# Patient Record
Sex: Female | Born: 1935 | Race: White | Hispanic: No | Smoking: Never smoker
Health system: Southern US, Community
[De-identification: ages and names within clinical notes are randomized; demographics above are authoritative.]

## PROBLEM LIST (undated history)

## (undated) DIAGNOSIS — M810 Age-related osteoporosis without current pathological fracture: Secondary | ICD-10-CM

## (undated) DIAGNOSIS — E079 Disorder of thyroid, unspecified: Secondary | ICD-10-CM

## (undated) DIAGNOSIS — D649 Anemia, unspecified: Secondary | ICD-10-CM

## (undated) DIAGNOSIS — K859 Acute pancreatitis without necrosis or infection, unspecified: Secondary | ICD-10-CM

## (undated) DIAGNOSIS — I499 Cardiac arrhythmia, unspecified: Secondary | ICD-10-CM

## (undated) DIAGNOSIS — G629 Polyneuropathy, unspecified: Secondary | ICD-10-CM

## (undated) DIAGNOSIS — J189 Pneumonia, unspecified organism: Secondary | ICD-10-CM

## (undated) DIAGNOSIS — E119 Type 2 diabetes mellitus without complications: Secondary | ICD-10-CM

## (undated) DIAGNOSIS — I10 Essential (primary) hypertension: Secondary | ICD-10-CM

## (undated) DIAGNOSIS — Z22322 Carrier or suspected carrier of Methicillin resistant Staphylococcus aureus: Secondary | ICD-10-CM

## (undated) DIAGNOSIS — D179 Benign lipomatous neoplasm, unspecified: Secondary | ICD-10-CM

## (undated) DIAGNOSIS — K811 Chronic cholecystitis: Secondary | ICD-10-CM

## (undated) DIAGNOSIS — E559 Vitamin D deficiency, unspecified: Secondary | ICD-10-CM

## (undated) DIAGNOSIS — I8290 Acute embolism and thrombosis of unspecified vein: Secondary | ICD-10-CM

## (undated) DIAGNOSIS — M1990 Unspecified osteoarthritis, unspecified site: Secondary | ICD-10-CM

## (undated) DIAGNOSIS — J449 Chronic obstructive pulmonary disease, unspecified: Secondary | ICD-10-CM

## (undated) DIAGNOSIS — D693 Immune thrombocytopenic purpura: Secondary | ICD-10-CM

## (undated) DIAGNOSIS — E785 Hyperlipidemia, unspecified: Secondary | ICD-10-CM

## (undated) DIAGNOSIS — D126 Benign neoplasm of colon, unspecified: Secondary | ICD-10-CM

## (undated) HISTORY — PX: BACK SURGERY: SHX140

## (undated) HISTORY — DX: Chronic obstructive pulmonary disease, unspecified: J44.9

## (undated) HISTORY — PX: APPENDECTOMY: SHX54

## (undated) HISTORY — PX: BLADDER SURGERY: SHX569

## (undated) HISTORY — PX: TONSILLECTOMY: SUR1361

## (undated) HISTORY — DX: Hyperlipidemia, unspecified: E78.5

## (undated) HISTORY — DX: Disorder of thyroid, unspecified: E07.9

## (undated) HISTORY — DX: Anemia, unspecified: D64.9

## (undated) HISTORY — PX: ABDOMINAL HYSTERECTOMY: SHX81

## (undated) HISTORY — DX: Immune thrombocytopenic purpura: D69.3

## (undated) HISTORY — DX: Benign lipomatous neoplasm, unspecified: D17.9

## (undated) HISTORY — DX: Unspecified osteoarthritis, unspecified site: M19.90

## (undated) HISTORY — DX: Chronic cholecystitis: K81.1

## (undated) HISTORY — DX: Vitamin D deficiency, unspecified: E55.9

## (undated) HISTORY — DX: Cardiac arrhythmia, unspecified: I49.9

## (undated) HISTORY — DX: Acute embolism and thrombosis of unspecified vein: I82.90

## (undated) HISTORY — DX: Age-related osteoporosis without current pathological fracture: M81.0

## (undated) HISTORY — DX: Acute pancreatitis without necrosis or infection, unspecified: K85.90

## (undated) HISTORY — DX: Polyneuropathy, unspecified: G62.9

## (undated) HISTORY — DX: Benign neoplasm of colon, unspecified: D12.6

## (undated) HISTORY — DX: Pneumonia, unspecified organism: J18.9

## (undated) HISTORY — DX: Carrier or suspected carrier of methicillin resistant Staphylococcus aureus: Z22.322

## (undated) HISTORY — PX: SPLENECTOMY: SUR1306

---

## 2005-03-17 ENCOUNTER — Ambulatory Visit: Payer: Self-pay | Admitting: Unknown Physician Specialty

## 2005-05-21 ENCOUNTER — Other Ambulatory Visit: Payer: Self-pay

## 2005-05-28 ENCOUNTER — Inpatient Hospital Stay: Payer: Self-pay | Admitting: Unknown Physician Specialty

## 2005-06-08 ENCOUNTER — Inpatient Hospital Stay (HOSPITAL_COMMUNITY)
Admission: RE | Admit: 2005-06-08 | Discharge: 2005-07-01 | Payer: Self-pay | Admitting: Physical Medicine & Rehabilitation

## 2005-06-08 ENCOUNTER — Ambulatory Visit: Payer: Self-pay | Admitting: Physical Medicine & Rehabilitation

## 2005-07-02 ENCOUNTER — Encounter
Admission: RE | Admit: 2005-07-02 | Discharge: 2005-09-30 | Payer: Self-pay | Admitting: Physical Medicine & Rehabilitation

## 2005-07-29 ENCOUNTER — Inpatient Hospital Stay: Payer: Self-pay

## 2005-07-31 ENCOUNTER — Other Ambulatory Visit: Payer: Self-pay

## 2005-08-03 ENCOUNTER — Ambulatory Visit: Payer: Self-pay | Admitting: Physical Medicine & Rehabilitation

## 2005-08-12 ENCOUNTER — Ambulatory Visit: Payer: Self-pay | Admitting: Physical Medicine & Rehabilitation

## 2005-08-30 ENCOUNTER — Encounter: Payer: Self-pay | Admitting: Unknown Physician Specialty

## 2005-09-26 ENCOUNTER — Emergency Department: Payer: Self-pay | Admitting: Emergency Medicine

## 2005-09-27 ENCOUNTER — Encounter: Payer: Self-pay | Admitting: Unknown Physician Specialty

## 2005-10-28 ENCOUNTER — Encounter: Payer: Self-pay | Admitting: Unknown Physician Specialty

## 2005-11-25 ENCOUNTER — Encounter: Payer: Self-pay | Admitting: Unknown Physician Specialty

## 2005-12-26 ENCOUNTER — Encounter: Payer: Self-pay | Admitting: Unknown Physician Specialty

## 2006-01-06 ENCOUNTER — Encounter
Admission: RE | Admit: 2006-01-06 | Discharge: 2006-04-06 | Payer: Self-pay | Admitting: Physical Medicine & Rehabilitation

## 2006-01-06 ENCOUNTER — Ambulatory Visit: Payer: Self-pay | Admitting: Physical Medicine & Rehabilitation

## 2006-01-25 ENCOUNTER — Encounter: Payer: Self-pay | Admitting: Unknown Physician Specialty

## 2006-02-25 ENCOUNTER — Encounter: Payer: Self-pay | Admitting: Unknown Physician Specialty

## 2006-03-27 ENCOUNTER — Encounter: Payer: Self-pay | Admitting: Unknown Physician Specialty

## 2006-04-27 ENCOUNTER — Encounter: Payer: Self-pay | Admitting: Unknown Physician Specialty

## 2006-04-29 ENCOUNTER — Ambulatory Visit: Payer: Self-pay | Admitting: Physical Medicine & Rehabilitation

## 2006-04-29 ENCOUNTER — Encounter
Admission: RE | Admit: 2006-04-29 | Discharge: 2006-07-28 | Payer: Self-pay | Admitting: Physical Medicine & Rehabilitation

## 2006-05-28 ENCOUNTER — Encounter: Payer: Self-pay | Admitting: Unknown Physician Specialty

## 2006-06-27 ENCOUNTER — Encounter: Payer: Self-pay | Admitting: Unknown Physician Specialty

## 2006-07-28 ENCOUNTER — Encounter: Payer: Self-pay | Admitting: Unknown Physician Specialty

## 2007-03-14 ENCOUNTER — Ambulatory Visit: Payer: Self-pay | Admitting: Unknown Physician Specialty

## 2007-03-14 ENCOUNTER — Other Ambulatory Visit: Payer: Self-pay

## 2007-03-20 ENCOUNTER — Ambulatory Visit: Payer: Self-pay | Admitting: Urology

## 2007-07-12 ENCOUNTER — Ambulatory Visit: Payer: Self-pay | Admitting: Family Medicine

## 2007-08-01 ENCOUNTER — Encounter: Admission: RE | Admit: 2007-08-01 | Discharge: 2007-08-01 | Payer: Self-pay | Admitting: Neurosurgery

## 2007-08-03 ENCOUNTER — Encounter: Admission: RE | Admit: 2007-08-03 | Discharge: 2007-08-03 | Payer: Self-pay | Admitting: Neurosurgery

## 2007-08-28 ENCOUNTER — Inpatient Hospital Stay (HOSPITAL_COMMUNITY): Admission: RE | Admit: 2007-08-28 | Discharge: 2007-09-16 | Payer: Self-pay | Admitting: Neurosurgery

## 2007-09-04 ENCOUNTER — Ambulatory Visit: Payer: Self-pay | Admitting: Physical Medicine & Rehabilitation

## 2007-09-16 ENCOUNTER — Inpatient Hospital Stay (HOSPITAL_COMMUNITY)
Admission: RE | Admit: 2007-09-16 | Discharge: 2007-09-24 | Payer: Self-pay | Admitting: Physical Medicine & Rehabilitation

## 2007-09-24 ENCOUNTER — Ambulatory Visit: Payer: Self-pay | Admitting: Cardiology

## 2007-09-24 ENCOUNTER — Inpatient Hospital Stay (HOSPITAL_COMMUNITY): Admission: AD | Admit: 2007-09-24 | Discharge: 2007-10-03 | Payer: Self-pay | Admitting: *Deleted

## 2007-09-24 ENCOUNTER — Ambulatory Visit: Payer: Self-pay | Admitting: Internal Medicine

## 2007-09-25 ENCOUNTER — Ambulatory Visit: Payer: Self-pay | Admitting: Vascular Surgery

## 2007-09-27 ENCOUNTER — Ambulatory Visit: Payer: Self-pay | Admitting: Infectious Diseases

## 2007-10-03 ENCOUNTER — Inpatient Hospital Stay (HOSPITAL_COMMUNITY)
Admission: RE | Admit: 2007-10-03 | Discharge: 2007-10-14 | Payer: Self-pay | Admitting: Physical Medicine & Rehabilitation

## 2007-10-03 ENCOUNTER — Ambulatory Visit: Payer: Self-pay | Admitting: Physical Medicine & Rehabilitation

## 2007-10-16 ENCOUNTER — Ambulatory Visit: Payer: Self-pay | Admitting: Family Medicine

## 2007-10-29 ENCOUNTER — Encounter: Admission: RE | Admit: 2007-10-29 | Discharge: 2007-10-29 | Payer: Self-pay | Admitting: Neurosurgery

## 2007-10-31 ENCOUNTER — Inpatient Hospital Stay (HOSPITAL_COMMUNITY): Admission: RE | Admit: 2007-10-31 | Discharge: 2007-11-04 | Payer: Self-pay | Admitting: Neurosurgery

## 2007-11-17 ENCOUNTER — Encounter: Admission: RE | Admit: 2007-11-17 | Discharge: 2007-11-17 | Payer: Self-pay | Admitting: Neurosurgery

## 2007-12-08 ENCOUNTER — Ambulatory Visit: Payer: Self-pay | Admitting: Family Medicine

## 2007-12-27 ENCOUNTER — Other Ambulatory Visit: Payer: Self-pay

## 2007-12-27 ENCOUNTER — Inpatient Hospital Stay: Payer: Self-pay | Admitting: Internal Medicine

## 2008-01-13 ENCOUNTER — Ambulatory Visit: Payer: Self-pay | Admitting: Family Medicine

## 2008-01-15 ENCOUNTER — Ambulatory Visit: Payer: Self-pay | Admitting: Family Medicine

## 2008-02-07 ENCOUNTER — Encounter: Admission: RE | Admit: 2008-02-07 | Discharge: 2008-02-07 | Payer: Self-pay | Admitting: Neurosurgery

## 2008-02-10 ENCOUNTER — Inpatient Hospital Stay: Payer: Self-pay | Admitting: Internal Medicine

## 2008-02-19 ENCOUNTER — Inpatient Hospital Stay (HOSPITAL_COMMUNITY): Admission: AD | Admit: 2008-02-19 | Discharge: 2008-02-29 | Payer: Self-pay | Admitting: Neurosurgery

## 2008-02-21 ENCOUNTER — Ambulatory Visit: Payer: Self-pay | Admitting: Infectious Disease

## 2008-06-01 ENCOUNTER — Inpatient Hospital Stay: Payer: Self-pay | Admitting: Internal Medicine

## 2008-10-30 ENCOUNTER — Encounter: Admission: RE | Admit: 2008-10-30 | Discharge: 2008-10-30 | Payer: Self-pay | Admitting: Neurosurgery

## 2008-11-28 ENCOUNTER — Inpatient Hospital Stay (HOSPITAL_COMMUNITY): Admission: RE | Admit: 2008-11-28 | Discharge: 2008-11-30 | Payer: Self-pay | Admitting: Neurosurgery

## 2009-09-23 IMAGING — NM NM PULM PERFUSION & VENT (REBREATHING & WASHOUT)
2 series · 12 of 12 positions shown · non-contrast
Comparison: 09/27/2007

CLINICAL DATA: Shortness of breath.
 NUCLEAR MEDICINE VENTILATION - PERFUSION LUNG SCAN:
TECHNIQUE: Wash-in, equilibrium, and wash-out phase ventilation images were obtained using Fe-ZYY gas.  Perfusion images were obtained in multiple projections after intravenous injection of Kc-PPm MAA.
 Radiopharmaceutical:  9.2 mCi Fe-ZYY gas and 5.6 mCi Kc-PPm MAA.

[Series 1: vq lung vent perf · 2.54mm/px · 6 of 20 frames shown (1 of 2)]
[frame 2/20  full-range]
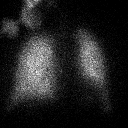
[frame 5/20  full-range]
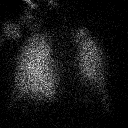
[frame 9/20]
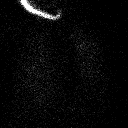
[frame 12/20]
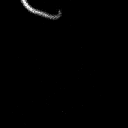
[frame 15/20]
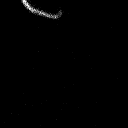
[frame 19/20]
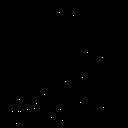

[Series 1: vq lung vent perf · 2.54mm/px · 6 of 20 frames shown (2 of 2)]
[frame 2/20  full-range]
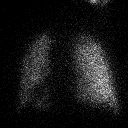
[frame 5/20  full-range]
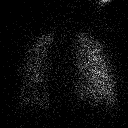
[frame 9/20]
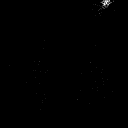
[frame 12/20]
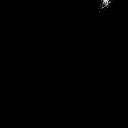
[frame 15/20]
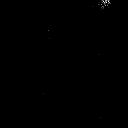
[frame 19/20]
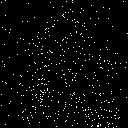

[12 of 12 positions shown; findings below may reference images not displayed]

FINDINGS: Chest radiograph shows findings consistent with mild CHF.  
 On the ventilation images, there is uniform distribution of xenon tracer to both lungs.
 There are no significant areas of xenon tracer retention on the washout images. 
 On the perfusion images no large or medium-sized perfusion defects are identified.
IMPRESSION: Low probability for acute pulmonary embolus.

## 2010-10-18 ENCOUNTER — Encounter: Payer: Self-pay | Admitting: Neurosurgery

## 2010-12-02 ENCOUNTER — Ambulatory Visit: Payer: Self-pay | Admitting: Pain Medicine

## 2011-01-07 LAB — GLUCOSE, CAPILLARY
Glucose-Capillary: 147 mg/dL — ABNORMAL HIGH (ref 70–99)
Glucose-Capillary: 165 mg/dL — ABNORMAL HIGH (ref 70–99)
Glucose-Capillary: 181 mg/dL — ABNORMAL HIGH (ref 70–99)
Glucose-Capillary: 214 mg/dL — ABNORMAL HIGH (ref 70–99)
Glucose-Capillary: 264 mg/dL — ABNORMAL HIGH (ref 70–99)
Glucose-Capillary: 270 mg/dL — ABNORMAL HIGH (ref 70–99)

## 2011-01-07 LAB — CBC
HCT: 41.6 % (ref 36.0–46.0)
Hemoglobin: 13.9 g/dL (ref 12.0–15.0)
MCHC: 33.5 g/dL (ref 30.0–36.0)
RBC: 4.45 MIL/uL (ref 3.87–5.11)
RDW: 17.1 % — ABNORMAL HIGH (ref 11.5–15.5)

## 2011-01-15 ENCOUNTER — Inpatient Hospital Stay: Payer: Self-pay | Admitting: Internal Medicine

## 2011-02-09 NOTE — Consult Note (Signed)
[name], HOLZHEIMER NO.:  [id number]   MEDICAL RECORD NO.:  [id number]          PATIENT TYPE:  IPS   LOCATION:  4011                         FACILITY:  [facility]   PHYSICIAN:  Michaelyn Barter, M.D. DATE OF BIRTH:  1935-11-03   DATE OF CONSULTATION:  DATE OF DISCHARGE:                                 CONSULTATION   HISTORY OF PRESENT ILLNESS:  Ms. [name] is a 75 year old female with  a past medical history of diabetes mellitus, hypertension, and  hyperlipidemia.  She is currently on the rehab floor.  She was admitted  on September 16, 2007.  She has a history cauda equina syndrome, multiple  lumbar surgeries, as well as back pain.  On August 28, 2007, she  underwent an L1-L2 re-exploration of a prior laminectomy with an L1 and  L2 laminotomy and foraminotomies, performed by Dr. Julio Sicks.  On  December 12th, a lumbar subarachnoid drain was placed secondary to  complications from the prior surgery during which time the patient  developed increased drainage from her back that was consistent with a  CSF fistula.  Following the patient's current admission on September 18, 2007, she  experienced an episode of nausea following by vomiting which has limited  her ability to partake in therapy.  It is also documented in the chart  that the patient has been experiencing pain since her admission to the  hospital.  Fentanyl Duragesic patch as well as Dilaudid 4 mg p.o. q.4 h.  p.r.n. has been provided to the patient.  On December 21st the patient grew greater than 100,000 colonies of yeast  in her urine and Diflucan was started.  On December 27th, it is documented by nursing that at approximately 3  p.m. the patient complained of pain and requested Dilaudid 4 mg for her  pain.  Approximately 4 hours later the patient was difficult to arouse.  The Dilaudid was later discontinued.  However, the patient has continued  to have mental status changes since that time.  On  September 24, 2007, the patient was noted to have begun to spike  fevers which have gone as high as 103.3.  The patient has also developed  hypotension.   PAST MEDICAL HISTORY:  1. Diabetes mellitus.  2. Hypertension.  3. Hyperlipidemia.  4. Nephrolithiasis.  5. Eczema for which the patient takes chronic prednisone.  6. Hypothyroidism.  7. GERD.  8. Chronic nausea.  9. ITP.   PAST SURGICAL HISTORY:  1. Appendectomy.  2. Splenectomy.  3. Multiple lumbar surgeries.  Please refer to the history of present      illness above.   ALLERGIES:  1. CODEINE.  2. SULFA.  3. LASIX.  4. ULTRAM.  5. OXYCODONE/APAP.   SOCIAL HISTORY:  Cigarettes:  Denies.  Alcohol:  Denies.   FAMILY HISTORY:  Positive for coronary artery disease and diabetes  mellitus.   REVIEW OF SYSTEMS:  The patient currently cannot provide.   PHYSICAL EXAMINATION:  GENERAL:  The patient is very sleepy.  She  arouses easily but she appears to be very groggy and confused.  She  mumbles.  Her words  are very difficult to understand.  HEENT:  Normocephalic atraumatic.  There is decreased pupillary response  bilaterally.  Oral mucosa is pink.  Dentures are present.  NECK:  Supple.  No JVD.  No lymphadenopathy.  No thyromegaly.  CARDIAC:  S1 S2 present.  RESPIRATORY:  No crackles or wheezes are appreciated.  BACK:  There is a long vertical surgical scar centrally located,  measuring approximately 12 inches in length with multiple Steri-Strips  present.  The wound looks clean.  EXTREMITIES:  No leg edema.  NEUROLOGIC:  The patient is confused.  MUSCULOSKELETAL:  Difficult to assess secondary to confusion.   LABORATORY:  CK-MB is 2.6, troponin I 0.04.  Sodium is 130, potassium  3.8, chloride 96, CO2 27, glucose 170, BUN 14, creatinine 0.59.  Bilirubin total 0.7, alk phos 74, SGOT 18, SGPT 18, total protein 5.4,  albumin 2.4, calcium 9.2.  White blood cell count 16.4, hemoglobin 11,  hematocrit 36, platelets 455.  PH  7.45, pCO2 38, pO2 103, bicarb 25,  oxygen sat 97.7.   ASSESSMENT/PLAN:  1. Altered mental status.  The etiology of the patient's mental status      is questionable.  The differential includes the effects of the pain      medications, in particular the Dilaudid that was provided to the      patient.  Other etiologies that need to be considered are an      infectious process.  The patient does have high fevers,      leukocytosis, therefore, one has to be concerned about the      possibility of sepsis.  Note therefore one has to be concerned      about the possibility of an infectious process and/or sepsis being      present.  Likewise, the patient does have hyponatremia present.      However, the sodium leve is not very low, therefore, whether or not      there is a metabolic component to the patient's confusion is      questionable.  In addition, one also has to be concerned about an      acute intracranial process.  Plan:  The patient has been started      empirically on intravenous Rocephin.  We will continue this for      now.  We may also consider adding intravenous vancomycin      empirically.  We will check blood cultures x2 as well repeating a      urinalysis plus microscopy.  A chest x-ray has already been      ordered.  We will follow up the results of this. Likewise a CT scan      of the patient's head has been ordered.  We will follow up the      results also.  The patient currently is hypotensive.  We will      therefore bolus the patient with intravenous fluid for now.  It is      reported in [place] that the patient had been on prednisone      chronically.  Whether or not this was orally versus topically is      questionable.  We will have to verify this with the patient's      family and if oral prednisone, we will consider restarting oral      prednisone versus giving intravenous steroids.  If the patient's blood cultures, urinalysis, and chest x-ray are not  impressive  and the  patient continues to spike fevers as well as have  persistent leukocytosis, an LP may need to be considered.  In light of  the patient having hypotension, despite her having hypertension at  baseline as well as having significant changes in her mental status as  well as high fevers, she needs to have a higher level of monitoring;  therefore, we will transfer the patient to stepdown.  1. Hypotension.  Whether or not this is associated with an infectious      process versus some other underlying etiology, in particular the      absence of prednisone being present currently in a patient who      chronically takes prednisone is questionable.  We will bolus the      patient with 0.9 normal saline for now and we will continue      fluid resuscitation, and start IV steroids.  2. Hyponatremia.  Will provide the patient with 0.9 normal saline for      now.  We may consider checking her sodium studies.      Michaelyn Barter, M.D.  Electronically Signed     OR/MEDQ  D:  09/24/2007  T:  09/24/2007  Job:  [id number]

## 2011-02-09 NOTE — Discharge Summary (Signed)
[name], [name]           ACCOUNT NO.:  [id number]   MEDICAL RECORD NO.:  [id number]          PATIENT TYPE:  INP   LOCATION:  3032                         FACILITY:  [facility]   PHYSICIAN:  Reinaldo Meeker, M.D. DATE OF BIRTH:  1936-04-13   DATE OF ADMISSION:  02/19/2008  DATE OF DISCHARGE:  02/29/2008                               DISCHARGE SUMMARY   PRIMARY DIAGNOSIS:  Lumbar wound infection.   PRIMARY OPERATIVE PROCEDURE:  None.   [name] is a 75 year old female with a complicated past medical  history.  In the summer she underwent a lumbar decompression and  stabilization procedure for an intradural arachnoid cyst after having  had a lumbar fusion in the past.  Her postoperative course was  complicated by CSF leak and wound infection.  The patient eventually  went back to the operating room and had more treatment.  She went to  rehab, was able to be discharged, and she was doing well.  She was  admitted back to [facility] in April for declining mental status,  was diagnosed with a urinary tract infection, was treated and  discharged.  The patient recently saw Dr. Jordan Likes, who was her treating  surgeon, and she was doing fairly well.  She then began to have some  deterioration, inability to get up and around.  She had some mental  status changes.  She was therefore brought to the emergency room for  severe dysarthria, somnolence and lethargy.   Eventually she was admitted at this time for a recurrence of her wound  infection.  She was admitted by Dr. Wynetta Emery on Feb 19, 2008.  She was kept  on vancomycin IV.  Her wound was healing well at the time of her  admission.  Infectious disease consultation was obtained and they agreed  with the present level of vancomycin and also gentamicin until the blood  cultures were improved.  She was started on rifampin as well.  Over  subsequent days she began to show some nice improvement with decreasing  pain and increasing  activity.  Her wound continued to do well.  Her C-  reactive protein was down to 6 on June 2 and it was felt that it was  time for her to be discharged to a skilled nursing facility.  Social  work was able to find her a place over the next few days and on June 4  she was discharged.   DISCHARGE MEDICATIONS:  Vancomycin, rifampin and Diflucan under the  recommendations of the infectious disease service.   Her condition was markedly improved versus admission.  Plan was for the  patient to return to see Dr. Jordan Likes in approximately 10 days.           ______________________________  Reinaldo Meeker, M.D.     ROK/MEDQ  D:  02/29/2008  T:  02/29/2008  Job:  [id number]

## 2011-02-09 NOTE — Discharge Summary (Signed)
NAMERYANE, KONIECZNY           ACCOUNT NO.:  [id number]   MEDICAL RECORD NO.:  [id number]          PATIENT TYPE:  INP   LOCATION:  5007                         FACILITY:  [facility]   PHYSICIAN:  Wilson Singer, M.D.DATE OF BIRTH:  08-02-1936   DATE OF ADMISSION:  09/24/2007  DATE OF DISCHARGE:  10/03/2007                               DISCHARGE SUMMARY   Discharge to rehabilitation unit.   FINAL DISCHARGE DIAGNOSES:  1. Methicillin-resistant Staphylococcus aureus bacteremia on day 10 of      intravenous vancomycin to complete a further 18 days.  2. Right leg deep vein thrombosis on anticoagulation.  3. Paroxysmal atrial fibrillation, currently in sinus rhythm.  4. Insulin-dependent diabetes mellitus.  5. L1-L2 back surgery in the recent past.   CONDITION ON DISCHARGE:  Stable.   MEDICATIONS ON DISCHARGE:  1. Intravenous vancomycin per pharmacy protocol.  2. Levothyroxine 25 mcg daily.  3. Timoptic eye drops 1 drop OP b.i.d.  4. Lexapro 10 mg daily.  5. Lantus insulin 25 units b.i.d.  6. Protonix 40 mg daily.  7. Aspirin 81 mg daily.  8. Coumadin per pharmacy protocol.  9. Digoxin 0.25 mg daily.  10.Prednisone 10 mg daily.  11.Sliding scale insulin.  12.Intravenous normal saline which can be discontinued now.   HISTORY:  This very pleasant 75 year old lady was initially in rehab and  then became unwell with atrial fibrillation and rapid ventricular  response with hypotension.  Please see the consultations by Dr.  Dietrich Pates, Cardiology.  Please see dictation by Dr. Michaelyn Barter on  his consultation dated September 24, 2007 when she was taken into  [facility] service.  Because of the hypotension and atrial fibrillation  and septic shock that were felt that what was going on,  the patient was  transferred to intensive care unit.  She did well there with volume  hydration and resuscitation as well as intravenous antibiotics.  Blood  cultures grew out MRSA and she improved  very quickly after this.   Since being on the regular floor, she has done well.  She was found to  have a right leg deep vein thrombosis without pulmonary embolism pain  clinically.  She is, therefore, being anticoagulated and her Coumadin is  therapeutic with an INR between the ranges of 2 and 3.  She is currently  on intravenous vancomycin and will complete day 10 today.  She needs a  further 18 days to complete a course of 28 days or 4 weeks of  intravenous vancomycin.  She is hypokalemic today and we are repleting  this.  She is eating and drinking well and her normal saline intravenous  fluids can be discontinued today.  She is on prednisone long term, and I  have cut her prednisone down to 10 mg a day now.   FURTHER DISPOSITION:  She will go to rehab unit today and her potassium  must be rechecked for supplementation today.  She will continue on the  Coumadin as per pharmacy and also intravenous vancomycin as mentioned  above.      Wilson Singer, M.D.  Electronically Signed  NCG/MEDQ  D:  10/03/2007  T:  10/03/2007  Job:  [id number]

## 2011-02-09 NOTE — Op Note (Signed)
[name], EISCHEN           ACCOUNT NO.:  [id number]   MEDICAL RECORD NO.:  [id number]          PATIENT TYPE:  INP   LOCATION:  3027                         FACILITY:  [facility]   PHYSICIAN:  Kathaleen Maser. Pool, M.D.    DATE OF BIRTH:  1936-07-10   DATE OF PROCEDURE:  DATE OF DISCHARGE:                               OPERATIVE REPORT   PREOPERATIVE DIAGNOSIS:  T11-T12 hardware failure with painful  instrumentation.   POSTOPERATIVE DIAGNOSIS:  T11-T12 hardware failure with painful  instrumentation.   PROCEDURE NOTE:  Re-exploration of thoracolumbar fusion with removal of  T11 and T12 segmental instrumentation.   SURGEON:  Kathaleen Maser. Pool, MD   ASSISTANT:  None.   ANESTHESIA:  General endotracheal.   INDICATIONS:  Ms. Vukelich is a 75 year old female who is status post  previous T11-S1 fusion.  The patient has had screw pullout at the upper  aspect of her fusion which results in prominent painful instrumentation.  The patient's fusion from L1 down to the sacrum is solid.  The lower  thoracic levels were included just temporarily to support the fusion at  the L1 level.  It was not felt critical for the long-term success for  T11 or T12 to incorporate into solid fusion.  With that in mind, it has  been decided to remove the instrumentation in hopes that the patient  will have less symptoms from the hardware, pinching and rubbing her soft  tissues.   OPERATIVE NOTE:  The patient was brought to the operating room, placed  on room table in supine position.  After an adequate level of anesthesia  achieved, the patient was prone onto a Wilson frame, appropriately  padded the patient's lumbar and thoracic regions, prepped and draped  sterilely.  A 10 blade was used to make a curvilinear skin incision  overlying the upper aspect of her hardware construct.  This was carried  down sharply in the midline. Subperiosteal dissection was then performed  exposing the lamina and facet joints of  T11 and T12 as well as  previously placed pedicle instrumentation and the transverse connector  just above the L1 level.  Hardware was disassembled.  Rods were cut  above the screws at L1.  The rods were removed.  Screws were removed.  Fusion was explored.  Solid fusion appeared to be present at T12 and L1.  T11-12 was not fused though once again there was no indication to revise  this level.  The wound was then irrigated with antibiotic solution.  Gelfoam was placed topically over a number of bony bleeding points.  The  wound was then closed in layers with Vicryl sutures.  Steri-Strips and  sterile dressing were applied.  There were no complications.  The  patient tolerated the procedure well and she returned to the recovery  room postoperatively.           ______________________________  Kathaleen Maser Pool, M.D.    HAP/MEDQ  D:  11/28/2008  T:  11/28/2008  Job:  [id number]

## 2011-02-09 NOTE — H&P (Signed)
NAMEJULANN, [name]           ACCOUNT NO.:  [id number]   MEDICAL RECORD NO.:  [id number]          PATIENT TYPE:  IPS   LOCATION:  4011                         FACILITY:  [facility]   PHYSICIAN:  [name], M.D.DATE OF BIRTH:  04-06-1936   DATE OF ADMISSION:  09/16/2007  DATE OF DISCHARGE:                              HISTORY & PHYSICAL   REASON FOR ADMISSION:  Functional deficits resulting from L1-L2  instability and stenosis as well as intradural arachnoid cyst.   HISTORY:  A 75 year old female with a prior history of cauda equina  syndrome, history of a posterolateral interbody fusion L2-L3 per Dr.  Bernette [name] in [place] had a post-op pseudomeningocele with repair on  May 28, 2005.  She was an inpatient in [name] from  June 08, 2005 to July 01, 2005.  She also has a past medical  history of back surgery in 1989, 1997.  On August 28, 2007, she  presented with progressive low back pain.  Workup and imaging showed an  L1-L2 instability with stenosis, also had evidence of intradural  arachnoid cyst.  She underwent L1-L2 exploration and redo of L1-L2  laminectomy and fusion and fenestration of arachnoid cyst on August 28, 2007, per Dr. Kathaleen [name].  She was fitted with a lumbar orthosis,  placed on a Decadron taper, postoperatively noted to have increased  drainage from the back consistent with CSF fistula that did not respond  to bedrest, placed on IV antibiotic coverage, underwent lumbar  subarachnoid drain placement on September 08, 2007, antibiotics  discontinued on December 18.  The patient has remained afebrile.  She  was fitted with ankle foot resting splints for ankle contracture  prevention and protection of heels.   REVIEW OF SYSTEMS:  Positive for right leg pain.  This is mainly in the  thigh and knee area and increases with movement.  She has no history of  arthritis in those area per her report.  She has numbness, decreased  sensation in  the lower extremities.  She has no cough, shortness of  breath.  She has no dysphagia or hearing deficits.  She has had no  sweats, chills, or weight loss.  GU:  She has frequency of urination but  no burning.  She has chronic low back pain but no new pain noted.   PAST HISTORY:  1. Hypertension.  2. NIDDM.  3. Hyperlipidemia.  4. Kidney stones.  5. She is on chronic prednisone for eczema.  6. Hypothyroidism.  7. GERD.  8. Chronic nausea.   PAST SURGICAL HISTORY:  1. Appendectomy.  2. Splenectomy for ITP.   HABITS:  Negative ETOH.  No tobacco.   FAMILY HISTORY:  Positive CAD and DM.   SOCIAL HISTORY:  Married, lives with spouse, chronic O2 but can assist,  1-level home with no steps to enter.   FUNCTIONAL HISTORY:  Independent with walker.   FUNCTIONAL STATUS:  Mild to max assist with transfers.   CURRENT MEDICATIONS:  1. Neurontin 300 mg t.i.d. at home but 300 b.i.d. in the [name].  2. Aspirin 81 mg per day both at home  and here in the [name].  3. Metoprolol 50 mg p.o. daily both in the [name] and at home.  4. Etodolac 4 mg a day both in [name] and home.  5. Mag-oxide 400 mg a day, both [name] and home.  6. Glipizide 10 mg per day at [name] and home.  7. Glucophage 1,000 mg p.o. b.i.d. [name] and home.  8. Hydromorphone, question dose at home, but was taking this and is      taking there here at the [name] 2 mg p.o. q.6 h. p.r.n.      breakthrough pain.  9. Lovastatin taking at home.  10.Synthroid 0.25 mg home dose.   ALLERGIES:  1. PERCOCET.  2. LATEX.  3. CODEINE.  4. SULFA.  5. TRAMADOL.  6. OXYCODONE.   Her last labs, hemoglobin 11, hematocrit 33, white count 10, platelets  550,000.  BUN 4, creatinine 0.4.  Hemoglobin A1c of 8 indicating  suboptimal control.   PHYSICAL EXAMINATION:  GENERAL:  Obese female in mild to moderate  distress complaining of right leg pain.  VITAL SIGNS:  Blood pressure 120/70, pulse 72, respirations 18, temp  98.  HEENT:  Eyes:  Anicteric.  Not injected.  External ENT normal.  Tongue  midline.  NECK:  Supple without adenopathy.  LUNGS:  Respiratory effort is good.  Lungs are clear to auscultation.  HEART:  Regular rate and rhythm.  No rubs, murmurs, extra sounds, no  pain over the chest area.  BACK:  Her thoracolumbar incision is clean, dry and intact.  She has a  dressing that has no drainage on it on the lower aspect of the incision.  No drains noted.  ABDOMEN:  Positive bowel sounds.  Soft, nontender on palpation.  EXTREMITIES:  No clubbing, cyanosis, or edema.  She has pain with  palpation of the knee and thigh as well as passive range of motion of  the knee and hip areas.  There is no swelling in the lower extremities.  She has no allodynia.  Knee shows no effusion.  Ankle shows no effusion.  Motor strength is 3 minus in the hip flexors, knee extensors, and ankle  dorsiflexors bilaterally.  She does have some hesitancy in terms of  moving because of fear of pain.  Her upper extremity strength is 5/5 in  the deltoid, biceps, triceps, finger flexors.  NEUROLOGIC:  Her orientation is to person, place, time with the  exception of date.   IMPRESSION:  1. Functional deficits related to paraparesis lumbar stenosis and      arachnoid cyst L1-L2, status post laminectomy and fusion August 28, 2007, postoperative day #19.  2. She had postoperative complications with cerebrospinal fluid      fistula, had a drain placement on September 08, 2007.  3. Pain management.  She is on a Fentanyl patch 25 mcg.  She is      getting as needed Dilaudid and also Neurontin twice a day.  Her      current pain in the right lower extremity appears to be neurogenic.      It does not appear to be vascular or musculoskeletal and therefore      we will increase her Neurontin to three times a day and may have to      go up to four times a day in a stepwise fashion.  4. In terms of rehabilitation program, we will  start a comprehensive      intensive inpatient rehabilitation  using:      a.     Physical therapy for range of motion, strength, and bed       mobility transfers, pre-gait training, gait training.      b.     Occupational therapy for range of motion, strengthening,       activities of daily living, cognitive perceptual training,       __________  and equipment.      c.     Rehabilitation registered nurse for skin care, wound care,       bowel and bladder training.      d.     In addition, we will ask case management to assess home       environment, assist with discharge planning, and appropriate       followup care.      e.     Social work to assess family and social support.  Of note is       that the patient's husband is on chronic oxygen.  Her daughter       appears supportive.  5. In terms of co-morbidities, insulin-dependent-diabetes mellitus      with a history of poor compliance or at least poor control.  We      will initiate NovoLog sliding scale, monitor CBG, and optimize      better control.  6. Hypertension.  Continue Toprol.  7. Hypothyroidism.  Continue Synthroid.  8. Depression.  Continue Lexapro, may need neuropsych to see the      patient as well given chronic pain and depression/anxiety.  9. Chronic eczema.  Continue prednisone.  This is also making diabetic      control more difficult.  10.Deep vein thrombosis prophylaxis will be with aspirin and thigh      high TED hose.   ESTIMATED LENGTH OF STAY:  One or 2 weeks.   PROGNOSIS:  For functional improvement is fair to good and will also  depend on her degree of pain control and ability to tolerate intensive  therapy program.      [name], M.D.  Electronically Signed     AEK/MEDQ  D:  09/16/2007  T:  09/17/2007  Job:  [id number]   cc:   Jaye Beagle [name], M.D.

## 2011-02-09 NOTE — Op Note (Signed)
NAMEEVONNE, RINKS           ACCOUNT NO.:  [id number]   MEDICAL RECORD NO.:  [id number]          PATIENT TYPE:  INP   LOCATION:  3310                         FACILITY:  [facility]   PHYSICIAN:  Kathaleen Maser. Pool, M.D.    DATE OF BIRTH:  [birth year]   DATE OF PROCEDURE:  09/08/2007  DATE OF DISCHARGE:                               OPERATIVE REPORT   SERVICE:  Neurosurgery.   PREOPERATIVE DIAGNOSIS:  Postoperative cerebrospinal fluid fistula.   POSTOPERATIVE DIAGNOSIS:  Postoperative cerebrospinal fluid fistula.   PROCEDURE:  Lumbar subarachnoid drain placement.   SURGEON:  Kathaleen Maser. Pool, M.D.   ANESTHESIA:  Local lidocaine.   INDICATIONS:  Ms. Wolter is a 75 year old female status post a  complicated spinal procedure including an intradural exploration and  fenestration of intradural cyst.  The patient presents with persistent  wound drainage consistent with CSF fistula.  This has not responded to  bedrest.  I have discussed the situation with the patient.  We have  decided to proceed with bedside lumbar drain placement.   PROCEDURE IN DETAIL:  The patient is in her hospital bed.  Her lumbar  region is prepped and draped sterilely after she has been placed in the  left lateral decubitus position.  Local anesthesia is used to infiltrate  the skin.  A 14 gauge Tuohy needle was then passed into the lumbar  subarachnoid space with return of CSF under moderate pressure.  The CSF  itself is  blood tinged but not obviously purulent.  The lumbar  subarachnoid catheter is then placed into the subarachnoid space.  This  drains CSF easily.  This was then sutured in place and connected to an  external drainage system.  The patient's lumbar wound that had been  draining was debrided and then reinforced with a nylon suture.  There  were no apparent complications of the procedure.  The patient tolerated  the procedure well and she remains in her hospital room postprocedure.     ______________________________  Kathaleen Maser. Pool, M.D.     HAP/MEDQ  D:  09/08/2007  T:  09/09/2007  Job:  [id number]

## 2011-02-09 NOTE — Discharge Summary (Signed)
NAMEAMESHIA, PEWITT NO.:  [id number]   MEDICAL RECORD NO.:  [id number]          PATIENT TYPE:  IPS   LOCATION:  4011                         FACILITY:  [facility]   PHYSICIAN:  Ellwood Dense, M.D.   DATE OF BIRTH:  25-Apr-1936   DATE OF ADMISSION:  09/16/2007  DATE OF DISCHARGE:  09/24/2007                               DISCHARGE SUMMARY   DISCHARGE DIAGNOSES:  1. Lumbar stenosis with arachnoid cyst, lumbar L1-2 status post      laminectomy and fusion August 28, 2007.  2. Postoperative complication with cerebrospinal fluid fistula with      drain placement September 08, 2007.  3. Pain management.  4. Hypertension.  5. Non-insulin-dependent diabetes mellitus.  6. Hyperlipidemia.  7. Hypothyroidism.   HISTORY OF PRESENT ILLNESS:  This is a 75 year old female history of  cauda equina syndrome and underwent posterior lateral fusion lumbar L2-3  in [place], [place] with postoperative pseudomeningocele with  repair on May 28, 2005.  She was an inpatient [facility] patient  from September 12, to July 01, 2005.  She has had other back surgeries  in 1989, 1987.  On August 28, 2007 she presented with progressive low  back pain.  Workup and imaging showed lumbar L1-2 instability with  stenosis, evidence of intradural arachnoid cyst.  She underwent lumbar  L1-2 exploration and redo lumbar L1-2 laminectomy fusion with  fenestration of arachnoid cyst on December 1 per Dr. Jordan Likes.  She was  fitted with back brace, placed on Decadron taper.  She had increased  drainage from her back consistent with cerebrospinal fluid fistula that  did not respond to bedrest; placed on intravenous antibiotics.  Underwent subarachnoid drain placement on December 12; antibiotics  discontinued December 18.  She was fitted with resting splints for ankle  contractures.   PAST MEDICAL HISTORY:  See discharge diagnoses.  No alcohol or tobacco.   SOCIAL HISTORY:  She is married,  lives with her husband.  Her husband  uses chronic oxygen but can assist on discharge.   ALLERGIES:  PERCOCET, LATEX, CODEINE, SULFA, TRAMADOL AND OXYCODONE.   HOSPITAL COURSE:  The patient was admitted to inpatient rehab services  with therapies initiated on a 3-hour daily basis consisting of physical  therapy, occupational therapy and rehabilitation nursing.  The following  issues were addressed during the patient's rehabilitation stay.  Pertaining to the patient's recent lumbar laminectomy fusion of lumbar  L1-2 with postoperative complications of cerebrospinal fluid fistula  with drain placement December 12.  The patient was doing well,  progressing slowly with therapies but participating, pain management  ongoing with the use of a fentanyl patch as well as Dilaudid and  Neurontin.  She was monitored for pain control closely.  She had a  history of diabetes mellitus.  Blood sugars were within acceptable  limits.  She continued on oral agents.  She did have some bouts of  constipation which resolved with laxative assistance.  Noted during her  rehabilitation hospital course, she was treated for a yeast urinary  tract infection.  Placed on Diflucan December 21.  It was noted on  December 27 the patient with some complaints of pain requested Dilaudid  4 mg an approximately 4 hours later she had some difficulty being  aroused.  The Dilaudid was later discontinued.  However, she did have  some mental status changes felt to be induced by narcotics.  She did  spike a fever to 103 and developed some hypotension.  Thus she was  transferred to acute care services, Dr. Michaelyn Barter accepted the  patient for acute care transfer and discharged from rehab services.   Noted latest lab work showed a white blood cell count of 16,400,  hemoglobin of 11, hematocrit 36, platelet 455,000.  Sodium 130,  potassium 3.8, BUN 14, creatinine 0.5, questionable sepsis.  She was  placed on antibiotic  intravenous coverage.  She did receive a bolus of  intravenous fluids for some hypotension.  Her mental status continued to  improve.  Dr. Jordan Likes of neurosurgery will be consulted for follow-up.   DISCHARGE CONDITION:  She was discharged in guarded condition.      Mariam Dollar, P.A.    ______________________________  Ellwood Dense, M.D.    DA/MEDQ  D:  09/25/2007  T:  09/25/2007  Job:  [id number]   cc:   Ellwood Dense, M.D.  Henry A. Pool, M.D.

## 2011-02-09 NOTE — Op Note (Signed)
NAMEAISLINN, [name]           ACCOUNT NO.:  [id number]   MEDICAL RECORD NO.:  [id number]          PATIENT TYPE:  INP   LOCATION:  3029                         FACILITY:  [facility]   PHYSICIAN:  Kathaleen Maser. Pool, M.D.    DATE OF BIRTH:  1936/09/19   DATE OF PROCEDURE:  10/31/2007  DATE OF DISCHARGE:                               OPERATIVE REPORT   PREOPERATIVE DIAGNOSIS:  Lumbar wound infection with chronic draining  sinus tract.   POSTOPERATIVE DIAGNOSIS:  Lumbar wound infection with chronic draining  sinus tract.   PROCEDURE NOTE:  Re-exploration of lumbar wound with irrigation and  debridement of lumbar wound.  Excision of lumbar sinus tract.   SURGEON:  Kathaleen Maser. Pool, M.D.   ANESTHESIA:  General oral endotracheal.   PREMEDICATION:  [name] is a 75 year old diabetic female who is  status post previous thoracolumbar decompression and fusion with  instrumentation as well as a fenestration of intradural arachnoid cyst.  The patient has had a long and somewhat rocky postoperative course.  Lately she has been home receiving IV antibiotics for MRSA septicemia.  She has no current evidence of sepsis.  However, she had some  intermittent purulent drainage from her lumbar wound.  Workup including  a recent MRI scan demonstrates a subcutaneous fluid collection worrisome  for either a chronic seroma for lumbar abscess.  We discussed options at  this time.  The patient presents now for I&D of her lumbar wound.   OPERATIVE NOTE:  Patient placed operative table in supine position.  After an adequate level anesthesia achieved the patient positioned prone  onto Wilson frame, appropriated padded, patient lumbar regions prepped  and draped sterilely.  10 blade made linear skin incision overlying the  lower aspect of lumbar wound.  Dermal draining sinus was excised.  The  subcutaneous cavity was then entered.  There is mixed purulence  consistent with old hematoma and some degree of  inflammation worrisome  for a chronic infection.  This was aggressively debrided throughout her  thoracolumbar wound.  All devitalized tissue was removed.  Power  irrigator was then used with antibiotic irrigation to fully cleanse the  wound.  Prior to power irrigation, cultures were taken.  Gram stains  from the cultures demonstrate no evidence of bacteria at this time.  After the wound was thoroughly irrigated with antibiotic irrigation,  topical vancomycin powder was laid down the operating bed.  Fascia was  reapproximated 0-0 Vicryl sutures.  Skin was reapproximated with 3-0  nylon in a vertical mattress fashion.  Two separate medium Hemovac  drains were exited through the skin and secured in place.  Sterile  dressing was applied.  There were no apparent complications.  The  patient was well and she returns to recovery room postoperatively.           ______________________________  Kathaleen Maser Pool, M.D.     HAP/MEDQ  D:  10/31/2007  T:  11/01/2007  Job:  [id number]

## 2011-02-09 NOTE — H&P (Signed)
[name], [name] NO.:  [id number]   MEDICAL RECORD NO.:  [id number]          PATIENT TYPE:  IPS   LOCATION:  4009                         FACILITY:  [facility]   PHYSICIAN:  Ranelle Oyster, M.D.DATE OF BIRTH:  1935-10-30   DATE OF ADMISSION:  10/03/2007  DATE OF DISCHARGE:                              HISTORY & PHYSICAL   CHIEF COMPLAINT:  Low back pain and weakness.   HISTORY OF PRESENT ILLNESS:  This is a 75 year old white female with [place]  on rehab about a week and half ago for lumbar stenosis status post  laminectomy and fusion.  On September 24, 2007, the patient developed  acute fever and hypotension.  We transferred her to acute services where  she was found to be septic with MRSA and was placed on vancomycin.  Planned  duration of therapy is 4 weeks total.  The patient had transient bouts  of atrial fibrillation while back on acute and was placed on digoxin.  Lower extremity Dopplers were done September 25, 2007, and the patient  had a right common femoral vein, profunda, and popliteal DVT.  She was  placed on IV heparin and Coumadin.  The patient became hypokalemic was  supplemented.  She is still having some issues with pain, although  aggressive pain medications are being avoided due to adverse side  effects.   Past medical history, family history, social history, functional  history, and prior medications are all per prior admission dictation.   ALLERGIES:  UNCHANGED.   LABORATORY DATA:  Hemoglobin 9.1, platelets 533,000, white blood cell  count 14,000.  Sodium 142, potassium 2.4, BUN 7, creatinine 1.01.   PHYSICAL EXAMINATION:  VITAL SIGNS:  Blood pressure 175/83, pulse is 60,  respiratory rate 18, temperature 97.0.  GENERAL:  Pleasant and in no acute distress.  HEENT:  Pupils equal, round, and reactive to light.  Ear, nose and  throat exam was unremarkable.  Fair dentition.  Pink oral mucosa.  NECK:  Supple without JVD or lymphadenopathy.  CHEST:  Clear to auscultation bilaterally without wheezes, rales, or  rhonchi.  HEART:  Regular rate and rhythm without murmurs, rubs, or gallop.  EXTREMITIES:  No clubbing, cyanosis and 1+ edema of both limbs.  ABDOMEN:  Soft, nontender.  Bowel sounds are positive.  SKIN:  Intact.  NEUROLOGICALLY:  Cranial nerves II-XII revealed ongoing decreased motor  plus/minus sensation in the lower extremities.  Reflexes are 1+.  The  patient had bilateral heel cord contractures of both ankles and a  positive Homans' sign on the left and a positive sign on the right as  well.  The patient's judgment, orientation, memory, and mood were all  within normal limits today.   ASSESSMENT AND PLAN:  1. Functional deficit secondary to lumbar stenosis, status post L1-L2      redo laminectomy and fusion, postoperative day number 36.  The      patient is also status post a subarachnoid drain placement for a      cerebrospinal fluid leak on September 08, 2007.  The patient      transferred from rehabilitation initially for methicillin-resistant  Staphylococcus aureus bacteremia and sepsis.  Begin comprehensive      inpatient rehabilitation with physical therapy to assess and treat      for range of motion and strengthening, transfers and gait.      Occupational therapy will assess and treat for range of motion,      strengthening, and activities of daily living, splinting and      equipment.  Rehabilitation nurse will follow on 24-hour basis for      bowel, bladder, skin, medication, and pain needs.  The      rehabilitation case manager/social worker will assess for      psychosocial needs and discharge planning.  Estimated length of      stay is 2 weeks.  Goals:  Supervision to modified independent.      Prognosis is fair to good.  2. Deep venous thrombosis treatment with Coumadin.  3. Transient atrial fibrillation.  Rate is controlled with Lanoxin      currently.  4. Diabetes.  Goal will be to  transition off Lantus to the patient's      home medication regimen.  For now, continue Lantus 25 units q.12h.      with sliding scale insulin coverage.  5. Mood.  Continue Lexapro.  6. Hypokalemia.  Potassium supplements.  Check electrolytes in the      morning.  7. Pain management with Darvocet p.r.n. to avoid excessive neuro      sedation.  8. Infectious disease.  Continue intravenous vancomycin for 4 weeks      total postoperatively.  9. Bladder.  Continue Foley catheter for now and advance once mobility      improves at which point we will begin a voiding trial.      Ranelle Oyster, M.D.  Electronically Signed     ZTS/MEDQ  D:  10/03/2007  T:  10/03/2007  Job:  [id number]

## 2011-02-09 NOTE — Discharge Summary (Signed)
[name], [name] NO.:  [id number]   MEDICAL RECORD NO.:  [id number]          PATIENT TYPE:  IPS   LOCATION:  4155                         FACILITY:  [facility]   PHYSICIAN:  Ellwood Dense, M.D.   DATE OF BIRTH:  1935-11-25   DATE OF ADMISSION:  10/03/2007  DATE OF DISCHARGE:  10/14/2007                               DISCHARGE SUMMARY   Dr. Jamse Belfast, [place], [place] [phone], fax number [phone].   DISCHARGE DIAGNOSES:  1. Lumbar stenosis, status post lumbar L1-2 redo, laminectomy, fusion      08/28/2007.  2. Questionable postoperative cerebrospinal leak, status post      subarachnoid drain, 09/08/2007.  3. Methicillin-resistant Staphylococcus aureus bacteremia with      intravenous vancomycin until 10/21/2007.  4. Right deep vein thrombosis to the right lower extremity with      Coumadin therapy.  5. Transient atrial fibrillation.  6. Hypothyroidism.  7. Diabetes mellitus.  8. Depression.  9. Eczema.  10.Hypokalemia.   HISTORY OF PRESENT ILLNESS:  This is a 75 year old white female with  history of cauda equina syndrome, underwent posterolateral fusion lumbar  L2-3 in [place], [place] with postoperative pseudo-meningocele  with repair 05/28/2005, for which she did receive inpatient rehab  services.  She has also had prior back surgeries in 1987 and 1989.  Admitted 08/28/2007 with progressive low back pain.  Workup and imaging  showed lumbar L1-2 instability with stenosis, evidence of intradural  arachnoid cyst.  She underwent lumbar L1-2 exploration redo laminectomy,  fusion with fenestration of arachnoid cyst 12/01 per Dr. Jordan Likes.  Fitted  with back brace and placed on Decadron taper.  She had developed  increased drainage from her back, placed on bed rest, later underwent  subarachnoid drain placement 09/08/2007.  Admitted 12/20 to inpatient  rehab services with slow progress on 12/28 with fever of 103,  hypotension, white blood cell count  16,400.  She received intravenous  bolus of intravenous fluids.  She was discharged to acute care services.  Suspect MRSA bacteremia, placed on intravenous vancomycin, which was to  be completed on 10/21/2007 after a 4-week course.  Transient bouts of  atrial fibrillation with [facility] followup.  Echocardiogram  with normal left ventricular function.  She had been placed on digoxin.  Doppler studies of lower extremities 12/29 showed a right CFV-profunda  popliteal deep vein thrombosis, placed on intravenous vancomycin and  Coumadin therapy.  Bouts of hypokalemia with supplement added.   PAST MEDICAL HISTORY:  See discharge diagnoses.   SOCIAL HISTORY:  No alcohol or tobacco.  Married.  Husband uses chronic  oxygen but can assist on discharge.   ALLERGIES:  CODEINE, SULFA, VICODIN, ULTRAM, OXYCODONE, and LATEX.   MEDICATIONS PRIOR TO ADMISSION:  Neurontin, aspirin, metoprolol,  etodolac, magnesium oxide, glipizide, lovastatin, Glucophage, Synthroid,  chronic prednisone for eczema.   REHABILITATION HOSPITAL COURSE:  The patient was admitted to inpatient  rehab services with therapies initiated on a 3-hour daily basis  consisting of physical therapy, occupational therapy and rehabilitation  nursing.  The following issues were addressed during the patient's  rehabilitation stay:  1. Pertaining to  [name]'s lumbar stenosis, lumbar L1-2 redo      laminectomy and fusion 12/01, se was followed by neurosurgery, Dr.      Jordan Likes.  Surgical site continued to heal, although she still had some      small areas of intermittent serous drainage, which essentially      resolved.  No evidence of significant deep infection as per Dr.      Jordan Likes.  She did continue on intravenous vancomycin for MRSA      bacteremia, which was to be completed on 10/21/2007.  She remained      afebrile.  She was using a Duragesic patch, low dose, changed every      72 hours for her pain as well as Darvocet  for breakthrough pain.      Her blood sugars overall continued to improve.  She was initially      on Lantus insulin as per protocol.  This was later discontinued and      she was started back on her glipizide at 10 mg daily. It was      questionable if her Glucophage would need to be resumed.  Her Foley      catheter tube was removed.  She was voiding without difficulty.      She remained on her hormone supplement for hypothyroidism.  She had      been placed on Lexapro for depression.  She was tolerating a long      hospital stay quite nicely with emotional support.  She continued      on chronic prednisone for eczema.  Functionally, she was minimal      assist for transfers, minimal assist to ambulate 20 feet with a      rolling walker, minimal assist upper body dressing, moderate assist      lower body with home health therapies to be arranged.  She was      wearing a back brace when out of bed.  Noted during her hospital      stay with findings of a right lower extremity deep vein thrombosis      on a Doppler study of 12/29.  She was placed on intravenous heparin      and Coumadin therapy, which she would remain on for 6-12 months.      It was the recommendation that she have home health nursing to draw      her blood.  Dr. Jamse Belfast, her primary care Salimata Christenson, was      contacted prior to hospital departure to follow Coumadin therapy      [phone].  Latest INR of 2.7 on 10/12/2007.   DISCHARGE MEDICATIONS:  At the time of dictation included:  1. Coumadin, latest dose of 0.5 mg.  This would be adjusted at day of      discharge.  2. Synthroid 25 mcg daily.  3. Timoptic ophthalmic solution 0.5%, 1 drop both eyes twice daily.  4. Lexapro 10 mg daily.  5. Protonix 40 mg daily.  6. Aspirin 81 mg daily.  7. Prednisone 10 mg daily.  8. Lanoxin 0.25 mg daily.  9. Multivitamin daily.  10.Duragesic patch 12 mcg, change every 72 hours.  11.Glipizide 10 mg daily.  12.Intravenous  vancomycin 1000 mg daily until 10/21/2007 and stop      Darvocet-N 100 one or two tablets q. 6 hours as needed pain.      Dispense 60 tablets.   The patient was to follow up with Dr.  Pool, neurosurgery, call for  appointment, Dr. Jamse Belfast, medical care Raimi Guillermo with a home health  nurse arranged to check next prothrombin time on 10/16/2007 with goal  INR to be 2-3 for Coumadin therapy.      Mariam Dollar, P.A.    ______________________________  Ellwood Dense, M.D.    DA/MEDQ  D:  10/13/2007  T:  10/13/2007  Job:  [id number]   cc:   Ellwood Dense, M.D.  Henry A. Pool, M.D.  [facility]

## 2011-02-09 NOTE — Discharge Summary (Signed)
NAMECRYSTAL, [name]           ACCOUNT NO.:  [id number]   MEDICAL RECORD NO.:  [id number]          PATIENT TYPE:  INP   LOCATION:  3030                         FACILITY:  [facility]   PHYSICIAN:  Kathaleen Maser. Pool, M.D.    DATE OF BIRTH:  [birth year]   DATE OF ADMISSION:  08/28/2007  DATE OF DISCHARGE:  09/16/2007                               DISCHARGE SUMMARY   FINAL DIAGNOSES:  1. L1-2 instability, status post L2-L5 fusion.  2. Intradural arachnoid cyst, postoperative meningocele secondary to      her previous lumbar surgeries.   OPERATIONS AND TREATMENTS:  1. L1-2 decompressive laminectomy, with L1-2 posterior lumbar antibody      fusion.  2. T11-L5 posterolateral arthrodesis, utilizing segmental pedicle      screw instrumentation.  3. Intradural exploration with fenestration of her intraspinal      intradural arachnoid cyst.   HISTORY OF PRESENT ILLNESS:  [name] is a 75 year old female who  has undergone a previous L2-L5 decompression and fusion at an outside  hospital.  The patient's surgery was complicated by an intraoperative  cerebrospinal fluid fistula and secondary to postoperative meningocele.  This underwent subsequent repair.  The patient then left with severe  back and bilateral lower extremity pain, which has made her non-  ambulatory and quite miserable.   Workup at this time demonstrates 2 significant findings.  The patient  has evidence of marked disk degeneration with instability at L1-2 level.  The patient also had evidence of a significant intradural arachnoid  cyst, causing compression of the upper cauda equina.  The patient  presents now for revision of her fusion and fenestration of the  arachnoid cyst.   HOSPITAL COURSE:  The patient went to the operating room, where an  uncomplicated T11-L5 fusion was performed.  The patient's arachnoid cyst  was fenestrated.  The patient was kept at bedrest postoperatively.  She  was gradually mobilized on her  fourth postoperative day.  Her back and  lower extremity pain were improved at that point.  She mobilized slowly.  Unfortunately, she began having difficulty with wound drainage, which  was consistent with a postoperative cerebrospinal fluid fistula.  She  subsequently underwent  treatment of this with a percutaneous lumbar  drain, which was kept in place for greater than 72 hours.  This resolved  the patient's CSF fistula.  Her back pain was significant but gradually  improved.  She was able to be mobilized with the aid of physical and  occupational therapy.  At the time of discharge the patient is upright  and ambulatory at minimal levels.  Her back and lower extremity pain are  much improved from a preoperative state.  The wound is currently healing  well.   CONDITION AT DISCHARGE:  Stable.   PLAN:  Discharge to the rehab unit for further inpatient rehabilitation.           ______________________________  Kathaleen Maser Pool, M.D.     HAP/MEDQ  D:  10/24/2007  T:  10/24/2007  Job:  [id number]

## 2011-02-09 NOTE — H&P (Signed)
[name], [name] NO.:  [id number]   MEDICAL RECORD NO.:  [id number]          PATIENT TYPE:  INP   LOCATION:  3032                         FACILITY:  [facility]   PHYSICIAN:  [name], M.D.        DATE OF BIRTH:  1936/08/08   DATE OF ADMISSION:  02/19/2008  DATE OF DISCHARGE:                              HISTORY & PHYSICAL   ADMITTING DIAGNOSIS:  Rule out lumbar epidural abscess.   HISTORY OF PRESENT ILLNESS:  The patient is a 75 year old female with a  complicated past medical history, who last December underwent a lumbar  decompression and stabilization procedure for an intradural arachnoid  cyst.  The postoperative course was complicated by CSF leak and wound  infection to which the patient ultimately ended up having to go back to  the OR and underwent VAC drain placement for healing and treatment of  the wound infection; however, the patient ultimately went to rehab and  was able to be discharged when he was doing well.  She was readmitted  back to [facility] back in early April for some declining in her mental  status and was diagnosed with a urinary tract infection was treated and  discharged.  The patient recently saw Dr. Jordan [name] approximately couple of  weeks ago and was doing fairly well at that time.  However, since then,  the patient had some slow deterioration in her ability to get up and get  around.  Underwent medication change by her primary care physician to a  fentanyl patch approximately a week and a half ago.  The patient  underwent significant mental status decline and subsequently placed on  the fentanyl patch.  The patient was brought to the ER for severe  dysarthrias, somnolence, and lethargy for rule out stroke about a week  ago, and was subsequently admitted.  During that admission process,  apparently workup to include blood cultures was significant for growing  out ultimately MRSA.  The patient was admitted and was placed on IV  antibiotics  and observed over the next several days in the hospital with  declining fever curve to where her admission temperature was 102.0;  however, persistent back and leg pain.  Underwent 2 MRI scans, 1 on  admission, which was on Feb 12, 2008, and 1 on the Feb 16, 2008, and the  MRI scans were read out as a progressing enhancing fluid collection  consistent with lumbar epidural abscess; however, the first MRI scan was  of poor quality and poorly defined fluid collection; however, the MRI  scan upon review reports also indicated decreasing level of apparent  spinal and soft tissue edema.  The patient on admission was noted to  have blood work referred to from the December 27, 2007 admission, where  there was a sed rate of 25, a white count of 14.3, BUN and creatinine of  3 and 0.4, and platelet count of 850.  During this hospitalization, the  patient did undergo TEE, which was negative for vegetation.  She  underwent repeat blood work.  Admission white count on Feb 12, 2008, was  14.3.  Admission sodium was 137, INR 1.7, white count came down over the  first couple of days in the hospital as well as INR stabilized to 1.2.  Further details of her echocardiogram, her ejection fraction was 50%.  Blood cultures, which were drawn on the admission did grew out MRSA.  The C-reactive protein performed on Feb 14, 2008, was very high at 154.8  and sed rate on Feb 14, 2008, was 25.  White count on the Feb 14, 2008,  was 15.2.  Currently, the patient's mental status prevents any adequate  history taking from her with regard to difference of changes in her  lower extremity pain on exam.  She does report she has phenomenons of  low back pain and worsening burning in her legs, taken from the family  and her husband.  She was occasionally ambulatory after initial  discharge back in February but however was not ambulated at home in  several weeks, and the last time she walked, she took apparently a  couple of steps  couple of weeks ago with a physical therapist but not  since then.  She has some baseline weakness in her lower extremities  that has been there for several months with bilateral foot drops to the  degree of which is unknown at this time.   PAST MEDICAL HISTORY:  Remarkable for hypertension,  hypercholesterolemia, type 2 diabetes, and history of DVT to which she  is on Coumadin, dose is unknown at this time.  Apparently, she was  diagnosed with a history of ITP, hypothyroidism, eczema, and she is  status post splenectomy.   ALLERGIES:  Include PERCOCET, DILAUDID, LATEX allergy, ULTRAM, CODEINE,  FENTANYL and SULFA allergy.   CURRENT MEDICATIONS:  Medication list from the outside hospital is not  available.  She apparently was on Coumadin on some dose, Synthroid,  Lexapro, Protonix, digoxin, glipizide, and prednisone.   PHYSICAL EXAMINATION:  GENERAL:  The patient is awake.  She is confused.  She is not oriented.  NECK:  Has no pain and full range of motion.  EXTREMITIES:  Upper extremity strength is 5/5.  Normal symmetrical  reflexes and sensation.  Lower extremity strength, the patient was very  difficult to be compliant with exam.  Does not seem to move it secondary  to pain.  The best I can get is 1-2 in her iliopsoas.  I can get  antigravity if I support her hamstrings.  She will keep her foot up;  however, she will not give much resistance and is complaining of severe  hypesthetic pain.  So, I will grade her quads at about 4/5 feet, but  virtually no  dorsiflexion at 0-1/5.  She did wiggle her toes.  Plantar  flexion is about 2-3/5 with EHL again about 1-2/5.   IMAGING STUDIES:  Her CD that came with her 2 MRI scans, both are lumbar  spine from Feb 12, 2008, and Feb 16, 2008, as well as the thoracic spine  do show a small about 1-1/2-cm ring-enhancing fluid collection  visualized from the Feb 16, 2008, that is apparently what appears to be  there on the Feb 12, 2008, however,  the poor quality MRI scan is not  visualized as well.  I am not convinced  that this has progressed so  much as it is better visualized on the second MRI scan.  Thoracic MRI  was essentially unremarkable with no evidence of epidural fluid  collection, or enhancing fluid collection, or mass effect from  epidural  abscess.   PLAN:  We will admit the patient.  We will recheck her sed rate and C-  reactive protein.  We will keep her on the vancomycin IV per hospital  protocol.  We will get a physical and occupational therapy.  We will  discuss her MRI with Dr. Jordan [name].  At this point, I do not think she needs  emergent reexploration of this as edema and soft tissues in the  paraspinal regions seem to be improving.  Even though this fluid  collection is better visualized, I am not convinced that it has  progressed.  However, depending on the results of her lab work and  reevaluation, it may warrant at some point some sort of aspiration.  Her  lumbar wound did undergo VAC placement after the last surgery and this  appears to be healing well.  So, we will await this blood work and will  observe the patient serially.  We will allow her mental status to  continue to improve, which the family reports has significantly improved  from a week ago and  we will continue to follow this along.           ______________________________  [name], M.D.     GC/MEDQ  D:  02/19/2008  T:  02/20/2008  Job:  [id number]

## 2011-02-09 NOTE — Op Note (Signed)
NAMECANDYCE, [name]           ACCOUNT NO.:  [id number]   MEDICAL RECORD NO.:  [id number]          PATIENT TYPE:  INP   LOCATION:  3172                         FACILITY:  [facility]   PHYSICIAN:  Kathaleen Maser. Pool, M.D.    DATE OF BIRTH:  1936/05/24   DATE OF PROCEDURE:  08/28/2007  DATE OF DISCHARGE:                               OPERATIVE REPORT   PREOPERATIVE DIAGNOSES:  1. 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  2. Postoperative intradural arachnoid cyst with compression on the      conus medullaris and cauda equina.   POSTOPERATIVE DIAGNOSES:  1. 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  2. Postoperative intradural arachnoid cyst with compression on the      conus medullaris and cauda equina.  3. Malpositioned right L2 pedicle screw.   OPERATION:  L1-2 re-exploration of laminectomy with redo L1 and L2  laminectomy and foraminotomies, more than would be required for simple  interbody fusion alone.  L1-2 posterior lumbar body fusion utilizing  tangent interbody allograft wedge, Telemon interbody PEEK cage and local  autografting.  Removal of L2-L5 instrumentation.  Re-exploration of L2-  L5 posterolateral fusion.  Posterolateral arthrodesis from T11-L5  utilizing segmental pedicle screw instrumentation local bone grafting  and bone graft extender.  Intradural exploration of the L1-L3 levels  with fenestration of an intradural arachnoid cyst utilizing  microdissection.  Intraoperative ultrasound.   SURGEON:  Kathaleen Maser. Pool, M.D.   ASSISTANT:  Donalee Citrin, M.D.   ANESTHESIA:  General endotracheal.   INDICATIONS:  Ms. Verville is a 75 year old female status post  previous L2-L5 fusion complicated by postoperative pseudomeningocele  which was repaired.  This procedure was done approximately 1-1/2 years  ago by an outside physician.  The patient has been in  severe pain and  essentially nonambulatory since time of the patient's surgery.  She has  failed all efforts at pain management.  Workup demonstrates two  significant problems.  1. The patient has evidence of instability with stenosis and disk      herniation at L1-2, worse on the right side.  This complicates her      thoracolumbar degenerative spondylolisthesis further.  2. The patient has evidence of probable malpositioned right-sided L2      pedicle screw.  3. The patient has evidence of intradural arachnoid cyst, likely      secondary to her previous CSF leak repair which appears to be      compressive upon the conus and nerve roots.  I have discussed the      situation with the patient.  I have made recommendations with      regard to surgical repair.  Patient aware the risks, benefits,      wished to proceed.   OPERATIVE NOTE:  Patient placed operating table in supine position.  After anesthesia was achieved the patient prone onto Wilson frame,  appropriately padded.  The patient's lumbar regions prepped and draped  sterilely.  10 blade used to make linear skin  incision extending from  approximately T10 down to L5.  This carried down sharply in the midline  __________  dissection then performed exposing lamina facet joints T10,  T11, T12 and L1 as well as the previously placed posterolateral fusion  from L2-L5.  Self-retaining retractor was placed.  Fluoroscopy used.  Levels were confirmed.  The instrumentation from L2-L5 was then  disconnected bilaterally.  The screws themselves appeared to be solid.  The fusion from L2-L5 appeared solid but this was difficult to ascertain  with great certainty.  The L1 lamina was dissected free.  Epidural scar  was resected from L1-L2.  A complete laminectomy of L1 was then  performed using Leksell rongeurs, Kerrison rongeurs, high-speed drill to  remove the entire lamina of L1 and complete inferior facetectomies of L1  were performed  bilaterally.  Superior facetectomies at L2 were performed  bilaterally.  Once again ligament flavum and epidural scar was elevated  and resected.  Underlying thecal sac and exiting L1 and L2 nerve roots  were identified and widely decompressed.  In the course of dissecting  free the right-sided L2 nerve root, it became apparent that the L2  pedicle screw on the right side was in fact medially positioned and  causing compression upon the L2 nerve root.  The screw was removed.  Given the size pedicle of this level it was decided not to replace any  type instrumentation at this level.  Starting first the patient's right  side.  Epidural venous plexus coagulated cut.  Thecal sac and nerve root  was gently mobilized retracted towards midline.  Disk herniation on the  right side was encountered.  This incised 15 blade in rectangular  fashion.  Wide disk space clean-out was achieved using pituitary  rongeurs upward angled pituitary rongeurs and Epstein curettes.  All  elements disk herniation completely resected.  All loose or obviously  degenerative disk material was removed from the interspace.  Disk space  was then distracted and attention was placed left side where similar  diskectomy was performed.  A 9 mm retractor was left the patient's left  side.  Distractor was removed from patient's right side.  Disk space  then reamed and cut with 8 mm tangent instrument.  Soft tissues removed  from interspace.  An 8 x 22 mm, Telemon cage packed with morselized  autograft was then packed into place recessed roughly 2 mm from  posterior cortical margin.  Distraction removed from the patient's left  side.  Thecal sac and nerve roots protected on the left side.  Disk  space once again reamed and cut with 8 mm tangent instrument.  Soft  tissues removed from interspace.  Morselized autograft and Progenics  putty was then packed in the interspace.  A 8 x 26 mm tangent wedge was  then packed into place and  recessed roughly 1 mm from posterior cortical  margin of L1.  Pedicles of L1, T12 and T11 were identified using surface  landmarks and intraoperative fluoroscopy.  Superficial bone overlying  the pedicle was then removed using high-speed drill.  Pedicles then  probed using pedicle awl.  Each pedicle awl track was then probed found  be solid in bone.  Each pedicle awl track was then tapped with a 4.5 mm  screw tap and then once again probed found be solid within bone.  6.0 x  45 mm Zimmer Optima screws placed bilaterally at L1, 5.0 x 40 mm screws  placed bilaterally at T11 and  T12.  All screws found to be well  positioned by fluoroscopy.  Transverse processes of the T11, T12 and L1  were then decorticated using high speed drill as well as the  posterolateral fusion mass at L2.  Morselized autograft was packed  posterolaterally and also interlaminarly at T11-12.  Titanium rod was  then cut and contoured, placed over the screw heads from T11 down to L5.  This was then attached using locking caps.  Locking caps were then given  final tightening with construct under compression.  Final images  revealed good position bone grafts, hardware proper operative level,  normal aligned spine.  Intraoperative ultrasound was performed.  Intraoperative ultrasound was used to visualize the dura.  This  confirmed the presence of some type of intradural compressive lesion.  With this in mind it was decided definitely to perform a midline  durotomy to better ascertain the cause of this compression.  The dura  was then incised 15 blade in the midline.  Dural leaflets were held in  place with traction sutures.  Arachnoid cyst biased towards the left  side was encountered.  This was then fenestrated and the arachnoid was  stripped.  This allowed the conus and cauda equina to resume its more  natural midline position.  At this point the intradural space was  explored.  The arachnoid cyst appeared to be completely  resected.  There  is no evidence of any other complicating features.  The intradural space  was then irrigated and then it was closed with a running 4-0 Nurolon  suture.  Gelfoam and Tisseel fibrin glue sealant was then placed over  the dural repair.  A transverse connector was placed.  A medium Hemovac  drain was left in the epidural space.  We then closed in layers with  Vicryl sutures.  Skin was then closed with a running 3-0 nylon suture.  There were no complications.  The patient tolerated procedure well and  she returns recovery room postoperatively.           ______________________________  Kathaleen Maser Pool, M.D.    HAP/MEDQ  D:  08/28/2007  T:  08/28/2007  Job:  [id number]

## 2011-02-09 NOTE — Consult Note (Signed)
[name], [name] NO.:  [id number]   MEDICAL RECORD NO.:  [id number]          PATIENT TYPE:  INP   LOCATION:  2008                         FACILITY:  [facility]   PHYSICIAN:  Mick Sell, MD DATE OF BIRTH:  July 29, 1936   DATE OF CONSULTATION:  09/27/2007  DATE OF DISCHARGE:                                 CONSULTATION   REQUESTING PHYSICIAN:  Dr. Nelda Bucks in critical care.   REASON FOR CONSULTATION:  Antibiotic treatment of MRSA bacteremia and  spine infection.   HISTORY OF PRESENT ILLNESS:  This is a pleasant, 75 year old female with  a history of multiple spinal surgeries, most recently on August 28, 2007 when she underwent an L1 and L2 exploration and redo of L1, L2  laminectomy fusion and fenestration of an arachnoid cyst by Dr. Jordan Likes.  She was treated at that time with a back brace as well as a Decadron  taper.  However, she developed cerebrospinal fistula and required  intravenous antibiotics as well as a subarachnoid drain placement on  December 12.  The drain was removed and antibiotics were discontinued on  December 18.  She was transferred to rehab and was doing well there  until several days ago when she developed altered mental status after  having received a dose of narcotic.  She then also developed high  fevers, hypotension, and leukocytosis.  She required fluid resuscitation  as well as a short period of pressors.  She also went into atrial  fibrillation and was transferred to the step down unit and monitored  closely by cardiology.  She had blood cultures done which then grew MRSA  which was treated with broad spectrum antibiotics.  She was also  evaluated by surgery who felt that she did have a superficial wound  infection in her back.  This swabbed culture grew MRSA.  She also had a  transthoracic echocardiogram done which revealed some valvular disease  but no evidence of vegetations.  She had repeat cultures done which have  been negative to date.  Since stabilizing from a blood pressure and  heart rate point of view, she has in fact defervesced and her fevers  have resolved.  She feels much better, only really complaining of some  continued back pain and pain on the left side.   PAST MEDICAL HISTORY:  1. Diabetes.  2. Hypertension.  3. Hyperlipidemia.  4. Nephrolithiasis.  5. Eczema.  6. Hypothyroidism.  7. GERD.  8. Chronic nausea.  9. Idiopathic thrombocytopenic purpura.   PAST SURGICAL HISTORY:  History of multiple lumbar surgeries including  most recently August 28, 2007 as per HPI.   ALLERGIES:  CODEINE, SULFA, LASIX, ULTRAM AND OXYCODONE.   SOCIAL HISTORY:  The patient denies tobacco or alcohol use.   FAMILY HISTORY:  Noncontributory.   REVIEW OF SYSTEMS:  Patient denies current fevers, chills, night sweats,  chest pain, cough, trouble breathing.  She does report some left side  and back pain. This seems to localize to her left hip.  She denies any  dysuria, hematuria, or rash.   PHYSICAL EXAMINATION:  VITAL SIGNS:  The patient is currently afebrile  with a temperature of 98.6, pulse of 58, blood pressure 148/70,  respirations 16, saturation of 95% on room air.  HEENT:  Pupils are equal, round and reactive to light and accommodation.  Extraocular movements are intact. Sclerae are anicteric. Oropharynx is  clear.  NECK:  Supple.  HEART:  Regular.  LUNGS:  Clear.  ABDOMEN:  Soft, nontender, nondistended.  BACK:  She has a very large midline incision, the superior aspect of  which does have some mild drainage and is slightly dehiscent.  There is  no surrounding erythema.  EXTREMITIES:  No clubbing, cyanosis or edema. Joints have no evidence of  joint effusion, warmth or tenderness.  She has a new PICC line in her left upper extremity, the right upper  extremity prior PICC line is clean, dry and intact.   DATA REVIEWED:  Patient has most recently had a white blood count of  9500,  hemoglobin 9.2, platelets of 475,000.  She has a BUN of 6,  creatinine 0.4.  LFTs are within normal limits.  Cortisol level is 43.5.  Vancomycin level was 11.1.  Wound culture from December 29 grew a few  staph aureus with sensitivities pending.  She had a urine culture from  December 30 with 40,000 yeast. She had a catheter tip cultured on  December 30 which is no growth to date.  Blood cultures from December 28 grew 2 of 2 MRSA.  Repeat blood cultures  December 29 are negative to date.   Patient has a VQ scan which is pending.  I am unable to review her  radiology as the system is down.  She did have a chest x-ray December  29th which showed mild right perihilar and left basilar subsegmental  atelectasis.   ASSESSMENT:  This is a pleasant 75 year old female with multiple medical  problems including several back surgeries, most recently earlier in the  month.  That surgery was complicated by a CSF leak requiring a lumbar  drain.  She had done quite well, however then had fevers, high white  count, and hypotension on December 28 related to MRSA bacteremia.  She  also grew MRSA from her superficial back wound.  Since being started on  appropriate antibiotics her white count has come down from 14 to 9.5  thousand and she is no longer hypotensive and has remained afebrile.  Repeat blood cultures are negative.  She has had a transthoracic  echocardiogram which is negative for vegetations but did show some  native valvular disease.   I think the most likely source for her MRSA bacteremia is her wound.  Neurosurgery has evaluated her and do not feel that this is a deep wound  but rather more of a superficial infection.  It does appear to be  responding to intravenous antibiotics however, it may in the future  require debridement.  Neurosurgery is recommending a 6-week course of  intravenous antibiotics.   I would agree with prolonged 4-6 week course of IV vancomycin.  Would  aim for a  trough level of 11-20.  I would also consider a  transesophageal echocardiogram to further rule out endocarditis in this  setting of MRSA bacteremia although she does have no evidence of  peripheral stigmata of endocarditis, she does have native valvular  disease on  her transthoracic echo.  The good news is she has cleared her cultures  and I think that if given her other issues it may be best to hold  on the  TEE at this time.   Thank you for the consult. Will continue to follow with you.  Please  call with any questions.      Mick Sell, MD  Electronically Signed     DPF/MEDQ  D:  09/27/2007  T:  09/27/2007  Job:  [id number]   cc:   Nelda Bucks, MD

## 2011-02-09 NOTE — Consult Note (Signed)
NAMEBROOKLEY, SPITLER           ACCOUNT NO.:  [id number]   MEDICAL RECORD NO.:  [id number]          PATIENT TYPE:  INP   LOCATION:  2901                           FACILITY:   PHYSICIAN:  Gerrit Friends. Dietrich Pates, MD, FACCDATE OF BIRTH:  Aug 10, 1936   DATE OF CONSULTATION:  09/24/2007  DATE OF DISCHARGE:                                 CONSULTATION   CARDIOLOGY CONSULTATION:   REFERRING PHYSICIAN:  Dr. Michaelyn Barter   HISTORY OF PRESENT ILLNESS:  A 75 year old woman with recent  neurosurgical procedure for spinal disease now referred for evaluation  of atrial fibrillation with rapid ventricular response and hypotension.  Ms. [name] has a complex history of multiple back procedures, the  most recent performed a month ago.  She subsequently was thought to have  a CSF fistula.  The exact  status of that problem is not currently  known.  She was transferred to the rehabilitation service approximately  5 days ago where she has not done well.  She has had GI symptoms and  generalized weakness and difficulty participating in the program.  Urine  culture grew Candida prompting treatment with Diflucan.  She was treated  with a course of antibiotics earlier in the month, but has not received  antibiotics for the past 2 weeks or so.  She has been treated with  fairly high doses of narcotics for continuing back pain.  Last night,  she received 4 mg of Dilaudid and was subsequently noted to have  impaired mental status.  Today, she developed a fever in excess of 103  and then became hypotensive.  She was treated with fluids and dopamine  with some improvement in her blood pressure.  Heart rate was not  apparently elevated at that time.  After transfer to the cardiac care  unit, atrial fibrillation with a very rapid ventricular rate was noted.  Dopamine was discontinued, 5 mg of intravenous metoprolol was  administered.  Her heart rate slowed and blood pressure transiently  improved.  Then,  she had additional hypotension requiring phenylephrine  and additional fluids.   PAST MEDICAL HISTORY:  Is notable for:  1. Diabetes.  2. Hypertension.  3. Hyperlipidemia.  4. Nephrolithiasis.  5. Eczema treated with chronic prednisone therapy.  6. Hypothyroidism.  7. GERD.  8. Chronic nausea.  9. ITP.   SURGERIES:  Other than her neurosurgical procedures have included:  1. Appendectomy.  2. Splenectomy.   She reports multiple drug allergies as listed in the chart.   SOCIAL HISTORY:  No use of tobacco products nor alcohol.   FAMILY HISTORY:  Is positive for coronary disease.   REVIEW OF SYSTEMS:  Is unavailable.   PHYSICAL EXAMINATION:  VITAL SIGNS:  On exam, blood pressure is now 100  systolic.  Heart rate 150 and irregular, respirations 20, temperature  98.9.  Afebrile.  MENTAL STATUS:  She is responsive with slightly slow mentation and  speech.  She answers questions appropriately.  NECK:  No jugular venous  distention; no carotid bruits.  HEENT:  Anicteric sclerae; normal oral mucosa.  LUNGS:  Clear.  CARDIAC:  Normal first and second heart  sounds; modest systolic ejection  murmur.  ABDOMEN:  Soft and nontender; normal bowel sounds; no organomegaly.  EXTREMITIES:  Distal pulses decreased; no edema.  NEUROMUSCULAR:  Symmetric movement of all extremities; no cranial nerve  abnormalities.   Recent metabolic profile is normal.  Cardiac markers are negative.  D-  dimer is 4.3.  White count is 14,000 with a hemoglobin of 11.5.   EKG:  At 2:50 p.m., some time before I was called, EKG showed atrial  fibrillation with a slow ventricular response.  Right bundle branch  block was present.  The tracing is of suboptimal quality.  There is a  left anterior fascicular block as well and ST depression in the  anterolateral leads.   Subsequent EKG shows sinus tachycardia with left anterior fascicular  block and right bundle branch block.  There is poor R-wave progression.   ST-segment changes are improved.   IMPRESSION:  Ms. [name] presents with hypotension that preceded  atrial fibrillation.  Her blood pressure problem was probably  exacerbated by AF with a rapid ventricular response.  The rate in atrial  fibrillation in turn was exacerbated by the use of dopamine.  Additional  blood pressure problems should be managed with phenylephrine or  Levophed.  We will load the patient with digoxin, which may provide some  benefit should atrial fibrillation recurs.  Emergent cardioversion is  always an option, if necessary.  I would not use additional metoprolol  if hypotension is a problem along with atrial fibrillation.  Sepsis is a  leading candidate to explain all of her problems although, with recent  surgery and prolonged bedrest, pulmonary embolism is also a concern.  Critical care medicine has seen the patient and initiated treatment for  possible sepsis and for hypotension.  We discussed anticoagulation and  will proceed with heparin without a bolus until the patient is stable  enough for definitive diagnostic testing.   We appreciate the request for consultation and will be happy to follow  this patient with you.      Gerrit Friends. Dietrich Pates, MD, [facility]  Electronically Signed     RMR/MEDQ  D:  09/24/2007  T:  09/25/2007  Job:  [id number]

## 2011-02-12 NOTE — Assessment & Plan Note (Signed)
HISTORY OF PRESENT ILLNESS:  [name] returns to the clinic today  accompanied by her husband.  The patient is a 75 year old female who  underwent lumbar decompression infusion May 28, 2005, by Dr. Gerrit Heck at  [facility].  She came to the rehabilitation unit a [facility]  through June 08, 2005, with significant lower extremity paresis.  Apparently, there has been some fluid buildup that occurred at the time of  surgery, and that may have led to the paresis that she experienced.   In any event, when I saw her in April 2007, she was able to walk only 30  feet with contact guard assistance using walker.  The patient has continued  in outpatient therapy at [facility] two times per  week.  She has made excellent progress overall and is presently walking with  a walker 630 feet, i.e., approximately greater than 200 yards before she  needs to rest.  She is reporting no problems with bowel or bladder function  at the present time.  She did follow up with Dr. Gerrit Heck on a regular basis  and plans to follow up next week.  She had been experiencing occasional low  back pain and obtained a TENS unit which helped substantially.  She also  uses occasional morphine 5 mg in the morning, and that is prescribed through  Dr. Gerrit Heck.   MEDICATIONS:  1.  Morphine 5 mg q.a.m. p.r.n.  2.  Premarin 0.625 mg daily.  3.  Toprol XL 50 mg daily.  4.  Synthroid 0.25 mg daily.  5.  Glucophage 1000 mg daily.  6.  Prednisone 5 mg daily.  7.  Methotrexate 2.5 mg q.weekly.  8.  Timoptic eye drops daily.  9.  Aspirin 81 mg daily.  10. Lexapro daily.  11. Lasix daily.   REVIEW OF SYSTEMS:  Positive for high blood sugar.   PHYSICAL EXAMINATION:  GENERAL APPEARANCE:  Well-appearing middle aged adult  female seated in a manual wheelchair.  VITAL SIGNS:  Blood pressure 130/70, pulse 75, respirations 17, O2  saturation 95% on room air.  EXTREMITIES:  Bilateral upper  extremity strength was 5/5, bilateral lower  extremities strength showed hip flexion at 4-/5 and knee extension at 4+/5.  Ankle dorsiflexion was approximately 0-1/5 bilaterally.  The patient reports  only slight decrease sensation in her bilateral lower extremities compared  to her upper extremities.   IMPRESSION:  1.  Status post lumbar decompressive fusion for degenerative scoliosis and      degenerative disk disease September 2006.  2.  Postoperative fluid collection with mass effect on the cord requiring      repeat exploration and neurogenic bowel and bladder secondary to #1,      resolved.  3.  Lower extremity paresis secondary to #1 and #2, improved.   PLAN:  In the office today, no refill on medication was necessary.  The  patient gets the minimal amount of morphine from Dr. Gerrit Heck and uses it on a  periodic basis.  She has made excellent progress overall in outpatient  therapies and I commended her for her effort.  She still is about a month  away from her original surgery date, but has made excellent progress from  complete paralysis back in 2006.  She wishes to continue therapy.  I have  encouraged her to do so as long as the insurance will allow.  She apparently  is making slow but steady progress, and that is acceptable with  the  insurance company.   We will plan on seeing the patient in follow up on an as needed basis.  She  is comfortable following up only with Dr. Gerrit Heck at this point, but will  follow up here on an as needed basis.           ______________________________  Ellwood Dense, M.D.     DC/MedQ  D:  05/04/2006 14:15:26  T:  05/04/2006 14:47:09  Job #:  [id number]

## 2011-02-12 NOTE — H&P (Signed)
[name], [name]           ACCOUNT NO.:  [id number]   MEDICAL RECORD NO.:  [id number]          PATIENT TYPE:  IPS   LOCATION:  4010                         FACILITY:  [facility]   PHYSICIAN:  Erick Colace, M.D.DATE OF BIRTH:  [birth year]   DATE OF ADMISSION:  06/08/2005  DATE OF DISCHARGE:                                HISTORY & PHYSICAL   HISTORY:  A 75 year old female with back and leg pain, right greater than  left side, head pain limiting ADLs, resistant to conservative care.  MRI  showed degenerative disk disease; lateral narrowing L2-3, 3-4, 4-5;  degenerative scoliosis.  Underwent lumbar decompression with fusion  May 28, 2005.  She had spinal wound debridement and drainage of  hematoma June 03, 2005.  Explored reason for lower extremity weakness  postop.  CT of L spine showed no significant cord compression. MRI September  7: Some fluid collection, mass effect on the cord.  Decompression revealed  serosanguineous fluid, no obvious spinal cord compression seen. No obvious  CSF leak.  She has had some improvement in her symptoms since the  exploration.   She has had a Foley.  She has had previous urinary problems prior to  surgery.  She has had no bowel problems since surgery.   PAST MEDICAL HISTORY:  1.  Diabetes.  2.  Hypertension.  3.  Chronic prednisone for eczema.   CURRENT MEDICATIONS:  1.  Toprol XL 50 mg p.o. daily.  2.  Neurontin 300 t.i.d.  3.  Synthroid 0.25 mg daily.  4.  Glucophage 1000 mg b.i.d.  5.  Prednisone 10 mg daily.  6.  Timolol eye drops 0.5% 1 drop both eyes b.i.d.  7.  Zyrtec 10 mg p.o. daily.  8.  Protonix 40 mg p.o. daily.  9.  Senokot 2 tablets nightly.  10. Suppository p.r.n.   ALLERGIES:  CODEINE, SULFA, ULTRAM.   PHYSICAL EXAMINATION:  GENERAL:  Elderly female in no acute distress.  Mood  and affect is anxious. She has full strength bilateral upper extremities at  the deltoid, biceps, grip.  In the lower  extremities, she has 3- in  bilateral knee extensors, 2- at the ankle dorsiflexors and plantar flexors.  She has quite a bit of pain with squeezing of the lower extremities.  She  had no evidence of swelling, no edema, no joint swelling.  She has pain with  only light brushing of the feet.  Pain with range of motion at the ankles  and knees, but this is mainly from the pressure areas grabbed by the manual  muscle testing.   IMPRESSION:  1.  Paraparesis, history of lumbar stenosis, multilevel radiculopathy,      status post decompression and fusion.  2.  Neurogenic pain inhibiting movement.  Will increase her Neurontin.  Will      also add low-dose tricyclic.  3.  Decreased mobility and activities of daily living.  Comprehensive      inpatient rehabilitation with PT, OT.  4.  Will have rehab nursing work on bowel and bladder issues.  Discontinue      catheter in a.m.  In  and out catheterization if retention noted.   Estimated length of stay is 1 to 2 weeks.  The patient is a fair rehab  candidate at this point.  She has been very reluctant to get out of bed, but  with continued improvement in pain, she may become more cooperative.      Erick Colace, M.D.  Electronically Signed     AEK/MEDQ  D:  06/08/2005  T:  06/08/2005  Job:  [id number]

## 2011-02-12 NOTE — Assessment & Plan Note (Signed)
DATE OF VISIT:  January 07, 2006   REASON FOR VISIT:  Ms. Nishida returns to clinic today accompanied by her  husband.  She is a 75 year old female who underwent lumbar decompression and  fusion on May 28, 2005, by Dr. Gerrit Heck at [facility].  She  unfortunately developed significant lower extremity paresis after the  surgery and was on the rehabilitation unit at [facility] through  June 08, 2005.   The patient continues to make slow, but gradual progress in outpatient  therapy at [facility] in [place].  She is able to stand 5 minutes  with a walker and able to walk 30 feet with contact guard assistance using  the walker.  Bowel and bladder function is good at this time.  She only has  minimal pain and uses morphine only on an as needed basis.  She is due to  follow up with Dr. Gerrit Heck over the next few weeks with last followup being  last week.  No changes were made in her overall regimen.  She did have Lasix  added for some lower extremity edema and Lexapro was also added for  depression.   MEDICATIONS:  1.  Premarin 0.625 mg daily.  2.  Toprol XL 50 mg daily.  3.  Synthroid 0.25 mg daily.  4.  Glucophage 1000 mg daily.  5.  Prednisone 5 mg daily.  6.  Methotrexate 2.5 mg each week.  7.  Timoptic eye drops daily.  8.  Aspirin 81 mg daily.  9.  Lexapro daily.  10. Lasix daily.   PHYSICAL EXAMINATION:  GENERAL:  Reasonably, well-appearing, middle-age,  adult female seated in a manual wheelchair.  VITAL SIGNS:  Blood pressure 100/55 with pulse 79, respirations 16, O2  saturations 95% on room air.  EXTREMITIES:  Bilateral upper extremity strength was 5-/5.  Bilateral lower  extremity strength was 3+/5 in hip flexion and 3+/5 in knee extension on the  right with 3/5 on the left.  Ankle dorsiflexion was 1/5 bilaterally.  Sensation was decreased to light touch and pin prick in the left thigh  compared to the right.   IMPRESSION:  1.  Status post  lumbar decompressive fusion for degenerative scoliosis and      degenerative disc disease on May 28, 2005.  2.  Postoperative fluid collection with mass-effect on the cord requiring      repeat exploration.  3.  Neurogenic bowel and bladder secondary to #1, improved.  4.  Lower extremity paresis secondary to #1 and #2.   PLAN:  In the office today, no refill on medications is necessary.  She  continues to do well overall and hopefully will be a household ambulator in  the future with a device.  She continues to make slow, but gradual progress  overall.  We will plan on seeing her in followup in approximately 4 months'  time.          ______________________________  Ellwood Dense, M.D.    DC/MedQ  D:  01/07/2006 16:30:14  T:  01/08/2006 08:33:30  Job #:  [id number]

## 2011-02-12 NOTE — Discharge Summary (Signed)
NAMEFALANA, CLAGG           ACCOUNT NO.:  [id number]   MEDICAL RECORD NO.:  [id number]          PATIENT TYPE:  INP   LOCATION:  3029                         FACILITY:  [facility]   PHYSICIAN:  Kathaleen Maser. Pool, M.D.    DATE OF BIRTH:  10-05-1935   DATE OF ADMISSION:  10/31/2007  DATE OF DISCHARGE:  11/04/2007                               DISCHARGE SUMMARY   FINAL DIAGNOSES:  Nonhealing and likely chronically infected  thoracolumbar wound.   HISTORY OF PRESENT ILLNESS:  Mr. Braid is a 75 year old female  having a history of persistent drainage from her thoracolumbar wound.  The patient presents now for I&D of her thoracolumbar wound and  obtaining of a deep wound space cultures.   HOSPITAL COURSE:  The patient taken to the operating room, where an  uncomplicated I&D of her thoracolumbar wound was performed.   FINDINGS:  Findings at the time of the surgery were that of chronic  purulence.  Cultures were negative initially, but a previously grown  Staphylococcus aureus.   PLAN:  Continue with IV vancomycin and local wound care.  I plan on  following up with her in 1 week after discharge.   CONDITION AFTER DISCHARGE:  Improving.   DISCHARGE DISPOSITION:  The patient will be discharged home on IV  antibiotics.  I plan on seeing her back in 1 week.           ______________________________  Kathaleen Maser. Pool, M.D.     HAP/MEDQ  D:  12/18/2007  T:  12/18/2007  Job:  [id number]

## 2011-02-12 NOTE — Discharge Summary (Signed)
NAMEKEYIRA, MONDESIR           ACCOUNT NO.:  [id number]   MEDICAL RECORD NO.:  [id number]          PATIENT TYPE:  IPS   LOCATION:  4010                         FACILITY:  [facility]   PHYSICIAN:  Greg Cutter, P.A. DATE OF BIRTH:  02/05/1936   DATE OF ADMISSION:  06/08/2005  DATE OF DISCHARGE:                                 DISCHARGE SUMMARY   No dictation for this job number.      Greg Cutter, P.A.     PP/MEDQ  D:  06/16/2005  T:  06/16/2005  Job:  [id number]

## 2011-02-12 NOTE — Discharge Summary (Signed)
NAMEBRIZA, BARK NO.:  [id number]   MEDICAL RECORD NO.:  [id number]          PATIENT TYPE:  IPS   LOCATION:  4010                         FACILITY:  [facility]   PHYSICIAN:  Ellwood Dense, M.D.   DATE OF BIRTH:  1936/04/21   DATE OF ADMISSION:  06/08/2005  DATE OF DISCHARGE:  07/01/2005                                 DISCHARGE SUMMARY   DISCHARGE DIAGNOSES:  1.  Cauda equina syndrome past posterior lumbar interbody fusion.  2.  Hypertension.  3.  Neurogenic bladder.  4.  Diabetes mellitus type 2.  5.  Neuropathy.   HISTORY OF PRESENT ILLNESS:  [name] is a 75 year old female with a  history of diabetes mellitus type, persistent low back pain with  radiculopathy, right lower extremity greater than left lower extremity,  secondary to lumbar stenosis, with scoliosis L2 to L5.  She elected to  undergo PLIF L2 to L5 by Dr. Gerrit Heck on September 1.  Postop course  complicated by bilateral lower extremity weakness, right greater than left,  past surgery.  MRI of September 7 showed fluid collection and mass effect of  spinal cord.  The patient taken back to OR September 17 for I&D and  evacuation.  The patient currently __________ 1/5 bilateral lower extremity  strength.  The patient has been unable to tolerate ambulation __________ and  standing.  Rehab was consulted for further therapy.   PAST MEDICAL HISTORY:  1.  Lumbar surgeries in 1989 and 1997.  2.  Eczema.  3.  Appendectomy.  4.  Colon polyps.  5.  DM type 2.  6.  Renal calculi.  7.  Splenectomy for ITP.   ALLERGIES:  CODEINE, SULFA and ULTRAM.   FAMILY HISTORY:  Positive for coronary heart disease.   SOCIAL HISTORY:  The patient is disabled but independent and married and  living in [place] prior to admission.  She does not use any tobacco or  alcohol.  She was independent prior to admission.   [facility] HOSPITAL COURSE:  Ms. Millisa Giarrusso was admitted to rehab on  June 08, 2005,  for inpatient therapies to consist of PT/OT daily.  At  time of admission she was noted to have pain of range of motion of knees and  ankles and some dysesthesias to light touch.  She was noted to have  increased pain with squeezing of lower extremities and strength was 3/5  bilateral knee extensors, 2/5 at ankle and plantar dorsiflexors.  The  patient's paraparesis was unchanged during her stay.  Therapies were  initiated and the patient was able to tolerate this and progress along.  Initially pain control was an issue with trials of OxyContin and OxyIR.  However, this was ineffective.  She was changed over to Ms-Contin and MSIR,  and that provided better relief.  Control of neuropathy was also an issue.  This was managed with addition of Lyrica 50 mg b.i.d. as well as increasing  of Neurontin to 800 mg t.i.d.  Dr. Leonides Cave of neuropsychiatry was consulted  for input and follow-up secondary to depressed mood and adjustment reaction.  He has been following  along throughout the stay, no signs of depression  reported.  Follow-up labs done past admission include check of CBC revealing  hemoglobin 10.9, hematocrit 30.8, white count 10.5, platelets 575.  Check of  electrolytes reveals sodium 135, potassium 3.8, chloride 99, CO2 29, BUN 5,  creatinine 0.4, glucose 165, total protein 5.3, albumin 2.3.  Check of C.  difficile toxin was done on October 5 as a question of diarrhea, and this  was noted to be negative.   The patient's back incision was noted to be healing well without any signs  or symptoms of infection, staples discontinued and area steri-stripped on  postop day 12 without difficulty.  The Foley was kept in place secondary to  issues of mobility and endurance.  The patient prefers to have voiding  trials on outpatient basis.  During her stay in rehab, the patient had  progressed to being at supervision for sliding board transfers.  She was  modified independent for sitting balance.   She requires supervision to  navigate lower legs.  He was modified independent for wheelchair set-up.  In  terms of ADLs, the patient requires set-up assist for upper body care,  supervision for low body bathing, minimum assist for low body dressing.  The  patient was educated and trained with the use of leg loops as well as  sliding board for ADL transfers.  The patient's family to continue to  provide assistance as needed past discharge.  Patient also to continue with  further follow-up, home health PT/OT by [facility] with home health  aide for B&D.  On July 01, 2005, the patient is discharged to home.   DISCHARGE MEDICATIONS:  1.  Toprol XL 50 mg a day.  2.  Levothyroxine 25 mcg a day.  3.  Levothyroxine 25 mcg a day.  4.  Glucophage 1000 mg b.i.d.  5.  MSIR 15 mg q.4-6h. p.r.n. pain.  6.  Zyrtec one per day.  7.  Trinsicon one p.o. b.i.d.  8.  Prilosec 20 mg a day.  9.  Prednisone 10 mg a day.  10. Ms-Contin 30 mg q.12h.  11. Timoptic 0.5% one drop both eyes b.i.d.  12. Neurontin 800 mg t.i.d.  13. Lyrica 50 mg b.i.d.  14. Senokot S two p.o. b.i.d.   DIET:  Regular.   ACTIVITY:  As tolerated at wheelchair level.   SPECIAL INSTRUCTIONS:  Continue with bowel program.  Continue routine Foley  care.   FOLLOW-UP:  Patient to follow up with Dr. Gerrit Heck and Dr. __________ on an  outpatient basis.      Greg Cutter, P.A.    ______________________________  Ellwood Dense, M.D.    PP/MEDQ  D:  09/15/2005  T:  09/17/2005  Job:  [id number]   cc:   Dr. Gerrit Heck

## 2011-02-12 NOTE — Assessment & Plan Note (Signed)
[name] returns to clinic today accompanied by her husband.  The  patient is a 75 year old female with history of chronic back and leg pain,  right greater than left.  She had failed conservative treatment with a prior  history of lumbar surgery back in the late 1990s.  MRI scan had shown  lateral narrowing at L2-3, L3-4, and L4-5 with degenerative scoliosis.  The  patient underwent lumbar decompression and fusion May 28, 2005, by Dr.  Gerrit Heck at [facility].  She reports that afterwards she did reasonably  well for several days and then they eventually found a fluid collection.  She had to undergo a second surgery with Dr. Gerrit Heck and after the second  surgery, she had significant paresis of her lower extremities as well as  neurogenic bowel and bladder.   A CT scan had shown no significant cord compression, although the fluid  collection was present with mass effect on the cord.  The patient underwent  her second surgery and then was stabilized.   The patient was admitted to [facility] June 08, 2005, and  eventually discharged home from there.  I do not have specifics regarding  the discharge paperwork.   The patient reports that she did see Dr. Gerrit Heck in follow-up last week.  He  reports that the surgery has gone well but there is no explanation for the  cause of her persistent paresis.  She is receiving home health therapies at  the present time with plans to start outpatient therapies over the next  several weeks.  She is using her Neurontin and asked about increasing the  dose to 800 mg twice a day, which she was on previously.  She had a recent  hospitalization when her dose of Neurontin was cut to 300 mg twice a day.  That recent hospitalization was for blood clots in her left leg, and she is  no on Coumadin for at least three to six months' time.   In terms of her bowel, she is doing a daily bowel program.  She uses a Foley  for bladder  continence.   MEDICATIONS:  1.  Premarin 0.625 mg daily.  2.  Toprol XL 50 mg daily.  3.  Synthroid 0.25 mg daily.  4.  Glucophage 1000 mg daily.  5.  Lodine 500 mg daily.  6.  Prednisone 5 mg daily.  7.  Methotrexate 2.5 mg each week.  8.  Timoptic eye drops daily.  9.  Aspirin 81 mg daily.   PHYSICAL EXAMINATION:  GENERAL:  A reasonably well-appearing adult female  seated in a manual wheelchair.  VITAL SIGNS:  Blood pressure was 112/52 with a pulse of 73, respiratory rate  16 and O2 saturation 98% on room air.  MUSCULOSKELETAL/NEUROLOGIC:  Bilateral upper extremity exam showed 5-/5  strength throughout.  Bulk and tone were normal.  Reflexes were present and  symmetrical.  Lower extremity exam showed hip flexion at 3-/5 bilaterally.  Knee extension was 3/5 on the right and 3-/5 on the left and ankle  dorsiflexion was 2/5 bilaterally.  Sensation was decreased to light touch at  below-knee level with some hypersensitivity of the bilateral feet.   IMPRESSION:  1.  Status post lumbar decompressive fusion for degenerative scoliosis and      degenerative disk disease May 28, 2005.  2.  Postoperative fluid collection with mass effect on the cords requiring      repeat exploration.  3.  Neurogenic bowel and bladder secondary  to #1.  4.  Lower extremity paresis secondary to #1/2.   In the office today, no refill on medication is necessary.  The patient will  be starting outpatient therapy and hopefully will make some progress from  her present level of essentially being wheelchair-bound.  Will plan on  seeing the patient in follow-up in this office in approximately three  months' time to allow her some time with the outpatient therapist.           ______________________________  Ellwood Dense, M.D.     DC/MedQ  D:  08/04/2005 15:29:01  T:  08/05/2005 06:27:49  Job #:  [id number]

## 2011-06-17 LAB — DIFFERENTIAL
Basophils Absolute: 0
Eosinophils Absolute: 0.1
Lymphocytes Relative: 22
Lymphs Abs: 1.6
Lymphs Abs: 2.9
Monocytes Absolute: 0.6
Monocytes Relative: 5
Neutro Abs: 9.8 — ABNORMAL HIGH
Neutro Abs: 10 — ABNORMAL HIGH
Neutrophils Relative %: 75

## 2011-06-17 LAB — CBC
HCT: 26 — ABNORMAL LOW
HCT: 29.4 — ABNORMAL LOW
HCT: 30.5 — ABNORMAL LOW
HCT: 30.6 — ABNORMAL LOW
Hemoglobin: 10.2 — ABNORMAL LOW
Hemoglobin: 10.3 — ABNORMAL LOW
Hemoglobin: 9.7 — ABNORMAL LOW
Hemoglobin: 9.8 — ABNORMAL LOW
MCHC: 32.5
MCHC: 32.8
MCHC: 33.2
MCV: 91.2
MCV: 92.5
MCV: 93.2
MCV: 92
Platelets: 501 — ABNORMAL HIGH
Platelets: 533 — ABNORMAL HIGH
Platelets: 542 — ABNORMAL HIGH
Platelets: 548 — ABNORMAL HIGH
RBC: 3.02 — ABNORMAL LOW
RBC: 3.27 — ABNORMAL LOW
RBC: 3.37 — ABNORMAL LOW
RBC: 3.4 — ABNORMAL LOW
RDW: 14.8
RDW: 15.4
RDW: 15.7 — ABNORMAL HIGH
WBC: 12 — ABNORMAL HIGH
WBC: 12.6 — ABNORMAL HIGH
WBC: 14.3 — ABNORMAL HIGH
WBC: 9.2
WBC: 9.7
WBC: 13.3 — ABNORMAL HIGH

## 2011-06-17 LAB — BASIC METABOLIC PANEL
BUN: 4 — ABNORMAL LOW
BUN: 6
BUN: 6
Calcium: 8.4
Calcium: 9.3
Chloride: 100
Chloride: 107
Creatinine, Ser: 0.81
Creatinine, Ser: 0.57
GFR calc Af Amer: >60
GFR calc Af Amer: >60
GFR calc non Af Amer: >60
GFR calc non Af Amer: >60
Glucose, Bld: 140 — ABNORMAL HIGH
Glucose, Bld: 219 — ABNORMAL HIGH
Potassium: 3.2 — ABNORMAL LOW
Potassium: 3.2 — ABNORMAL LOW
Potassium: 3.7
Sodium: 141

## 2011-06-17 LAB — PROTIME-INR
INR: 1.1
INR: 1.5
INR: 1.6 — ABNORMAL HIGH
INR: 1.9 — ABNORMAL HIGH
INR: 1.9 — ABNORMAL HIGH
INR: 2.2 — ABNORMAL HIGH
INR: 0.9
INR: 2.4 — ABNORMAL HIGH
INR: 2.4 — ABNORMAL HIGH
INR: 2.7 — ABNORMAL HIGH
Prothrombin Time: 14.6
Prothrombin Time: 19.3 — ABNORMAL HIGH
Prothrombin Time: 22.3 — ABNORMAL HIGH
Prothrombin Time: 24.7 — ABNORMAL HIGH
Prothrombin Time: 25.4 — ABNORMAL HIGH
Prothrombin Time: 12.5
Prothrombin Time: 26.5 — ABNORMAL HIGH
Prothrombin Time: 27.2 — ABNORMAL HIGH

## 2011-06-17 LAB — COMPREHENSIVE METABOLIC PANEL
ALT: 18
ALT: 18
AST: 13
AST: 16
Albumin: 2 — ABNORMAL LOW
Albumin: 2.1 — ABNORMAL LOW
Alkaline Phosphatase: 60
Alkaline Phosphatase: 80
BUN: 5 — ABNORMAL LOW
CO2: 28
CO2: 30
Calcium: 8.4
Calcium: 8.6
Chloride: 108
Creatinine, Ser: 1.01
GFR calc Af Amer: >60
GFR calc Af Amer: >60
GFR calc non Af Amer: 54 — ABNORMAL LOW
GFR calc non Af Amer: >60
Glucose, Bld: 61 — ABNORMAL LOW
Potassium: 3.2 — ABNORMAL LOW
Potassium: 3.5
Sodium: 138
Sodium: 142
Total Bilirubin: 0.6
Total Protein: 5.1 — ABNORMAL LOW

## 2011-06-17 LAB — PHOSPHORUS: Phosphorus: 3.4

## 2011-06-17 LAB — CULTURE, BLOOD (ROUTINE X 2): Culture: NO GROWTH

## 2011-06-17 LAB — HEPARIN LEVEL (UNFRACTIONATED)
Heparin Unfractionated: 0.31
Heparin Unfractionated: 0.73 — ABNORMAL HIGH

## 2011-06-17 LAB — SEDIMENTATION RATE: Sed Rate: 2

## 2011-06-17 LAB — VANCOMYCIN, RANDOM: Vancomycin Rm: 15.4

## 2011-06-17 LAB — VANCOMYCIN, TROUGH: Vancomycin Tr: 35.2

## 2011-06-17 LAB — MAGNESIUM
Magnesium: 1.4 — ABNORMAL LOW
Magnesium: 1.4 — ABNORMAL LOW

## 2011-06-17 LAB — APTT: aPTT: 32

## 2011-06-18 LAB — PROTIME-INR
INR: 1.1
INR: 1.1
INR: 1.1
Prothrombin Time: 14
Prothrombin Time: 14.2

## 2011-06-18 LAB — HEMOGLOBIN A1C: Mean Plasma Glucose: 168

## 2011-06-18 LAB — GRAM STAIN

## 2011-06-18 LAB — WOUND CULTURE

## 2011-06-21 ENCOUNTER — Inpatient Hospital Stay: Payer: Self-pay | Admitting: Surgery

## 2011-06-23 LAB — BASIC METABOLIC PANEL
BUN: 1 — ABNORMAL LOW
BUN: 2 — ABNORMAL LOW
BUN: 2 — ABNORMAL LOW
BUN: 3 — ABNORMAL LOW
BUN: 3 — ABNORMAL LOW
BUN: <1 — ABNORMAL LOW
CO2: 26
CO2: 28
Calcium: 8.8
Calcium: 9.3
Chloride: 103
Chloride: 104
Creatinine, Ser: 0.42
Creatinine, Ser: 0.5
GFR calc Af Amer: >60
GFR calc non Af Amer: >60
GFR calc non Af Amer: >60
GFR calc non Af Amer: >60
Glucose, Bld: 173 — ABNORMAL HIGH
Glucose, Bld: 192 — ABNORMAL HIGH
Glucose, Bld: 77
Potassium: 3.6
Potassium: 3.6
Potassium: 3.9
Potassium: 4.4
Sodium: 135
Sodium: 137

## 2011-06-23 LAB — CBC
HCT: 33.4 — ABNORMAL LOW
HCT: 33.4 — ABNORMAL LOW
HCT: 35.9 — ABNORMAL LOW
HCT: 36
Hemoglobin: 11.6 — ABNORMAL LOW
MCHC: 32.4
MCHC: 32.4
MCV: 89
MCV: 89
MCV: 89.1
Platelets: 564 — ABNORMAL HIGH
Platelets: 620 — ABNORMAL HIGH
Platelets: 730 — ABNORMAL HIGH
Platelets: 732 — ABNORMAL HIGH
Platelets: 733 — ABNORMAL HIGH
RBC: 3.75 — ABNORMAL LOW
RDW: 18.3 — ABNORMAL HIGH
RDW: 18.9 — ABNORMAL HIGH
RDW: 19 — ABNORMAL HIGH
WBC: 10.2
WBC: 5.4
WBC: 6.8

## 2011-06-23 LAB — CULTURE, BLOOD (ROUTINE X 2): Culture: NO GROWTH

## 2011-06-23 LAB — SEDIMENTATION RATE: Sed Rate: 55 — ABNORMAL HIGH

## 2011-06-23 LAB — DIFFERENTIAL: Basophils Relative: 0

## 2011-06-23 LAB — APTT: aPTT: 28

## 2011-06-24 LAB — URINALYSIS, ROUTINE W REFLEX MICROSCOPIC
Glucose, UA: NEGATIVE
Hgb urine dipstick: NEGATIVE
Ketones, ur: NEGATIVE
Protein, ur: NEGATIVE
Urobilinogen, UA: 0.2

## 2011-06-24 LAB — COMPREHENSIVE METABOLIC PANEL
AST: 20
Albumin: 2.1 — ABNORMAL LOW
BUN: <1 — ABNORMAL LOW
Calcium: 9
Chloride: 105
Creatinine, Ser: 0.54
GFR calc Af Amer: >60
Total Protein: 5.4 — ABNORMAL LOW

## 2011-06-24 LAB — OCCULT BLOOD X 1 CARD TO LAB, STOOL: Fecal Occult Bld: NEGATIVE

## 2011-06-24 LAB — CBC
MCHC: 33.3
RBC: 3.47 — ABNORMAL LOW

## 2011-06-24 LAB — URINE MICROSCOPIC-ADD ON

## 2011-06-24 LAB — SEDIMENTATION RATE: Sed Rate: 46 — ABNORMAL HIGH

## 2011-06-24 LAB — URINE CULTURE: Colony Count: >=100000

## 2011-06-24 LAB — HEMOGLOBIN AND HEMATOCRIT, BLOOD
HCT: 32.2 — ABNORMAL LOW
Hemoglobin: 10.4 — ABNORMAL LOW

## 2011-06-24 LAB — C-REACTIVE PROTEIN: CRP: 6.8 — ABNORMAL HIGH (ref <0.6)

## 2011-06-24 LAB — AMYLASE: Amylase: 24 — ABNORMAL LOW

## 2011-06-29 HISTORY — PX: CHOLECYSTECTOMY: SHX55

## 2011-07-02 LAB — COMPREHENSIVE METABOLIC PANEL
AST: 15
Albumin: 2 — ABNORMAL LOW
Albumin: 2.4 — ABNORMAL LOW
Albumin: 2.4 — ABNORMAL LOW
Alkaline Phosphatase: 111
Alkaline Phosphatase: 67
Alkaline Phosphatase: 74
BUN: 13
BUN: 14
BUN: 3 — ABNORMAL LOW
BUN: 9
CO2: 29
Calcium: 8.9
Calcium: 9.2
Chloride: 101
Chloride: 107
Creatinine, Ser: 0.35 — ABNORMAL LOW
Creatinine, Ser: 0.52
Creatinine, Ser: 0.59
GFR calc Af Amer: >60
GFR calc non Af Amer: >60
GFR calc non Af Amer: >60
GFR calc non Af Amer: >60
Glucose, Bld: 140 — ABNORMAL HIGH
Glucose, Bld: 170 — ABNORMAL HIGH
Glucose, Bld: 202 — ABNORMAL HIGH
Potassium: 3.6
Total Bilirubin: 0.2 — ABNORMAL LOW
Total Bilirubin: 0.6
Total Protein: 5.4 — ABNORMAL LOW
Total Protein: 5.4 — ABNORMAL LOW

## 2011-07-02 LAB — BLOOD GAS, ARTERIAL
Bicarbonate: 23
O2 Content: 2
O2 Content: 2
O2 Saturation: 92.4
O2 Saturation: 96.1
O2 Saturation: 97.7
Patient temperature: 98.6
Patient temperature: 98.6
TCO2: 24.2
pH, Arterial: 7.313 — ABNORMAL LOW
pH, Arterial: 7.394
pH, Arterial: 7.45 — ABNORMAL HIGH
pO2, Arterial: 103 — ABNORMAL HIGH
pO2, Arterial: 82.7

## 2011-07-02 LAB — CARBOXYHEMOGLOBIN
Carboxyhemoglobin: 1.2
Carboxyhemoglobin: 1.5
Methemoglobin: 1.2
Methemoglobin: 1.3
Methemoglobin: 1.4
O2 Saturation: 70.1
O2 Saturation: 97.1
O2 Saturation: 99.5
Total hemoglobin: 8.8 — ABNORMAL LOW
Total hemoglobin: 8.9 — ABNORMAL LOW
Total oxygen content: 70.1 — ABNORMAL HIGH

## 2011-07-02 LAB — CBC
HCT: 28.4 — ABNORMAL LOW
HCT: 28.8 — ABNORMAL LOW
HCT: 32.4 — ABNORMAL LOW
HCT: 33.1 — ABNORMAL LOW
HCT: 36
HCT: 36.2
Hemoglobin: 11 — ABNORMAL LOW
Hemoglobin: 8.9 — ABNORMAL LOW
Hemoglobin: 9.3 — ABNORMAL LOW
MCHC: 30.5
MCV: 94.5
MCV: 94.7
MCV: 95.3
MCV: 97.3
Platelets: 354
Platelets: 433 — ABNORMAL HIGH
Platelets: 455 — ABNORMAL HIGH
Platelets: 475 — ABNORMAL HIGH
Platelets: 550 — ABNORMAL HIGH
RBC: 2.89 — ABNORMAL LOW
RBC: 3.03 — ABNORMAL LOW
RBC: 3.33 — ABNORMAL LOW
RBC: 3.45 — ABNORMAL LOW
RBC: 3.83 — ABNORMAL LOW
RDW: 14.6
WBC: 10
WBC: 11.9 — ABNORMAL HIGH
WBC: 14.1 — ABNORMAL HIGH
WBC: 7.5
WBC: 9.5
WBC: 9.7

## 2011-07-02 LAB — HEPARIN LEVEL (UNFRACTIONATED)
Heparin Unfractionated: 0.28 — ABNORMAL LOW
Heparin Unfractionated: 0.31
Heparin Unfractionated: 0.36
Heparin Unfractionated: 0.46

## 2011-07-02 LAB — BASIC METABOLIC PANEL
BUN: 4 — ABNORMAL LOW
CO2: 24
Chloride: 109
Creatinine, Ser: 0.43
GFR calc Af Amer: >60
GFR calc Af Amer: >60
GFR calc non Af Amer: >60
Potassium: 3.5
Potassium: 4.1

## 2011-07-02 LAB — URINALYSIS, ROUTINE W REFLEX MICROSCOPIC
Bilirubin Urine: NEGATIVE
Glucose, UA: NEGATIVE
Hgb urine dipstick: NEGATIVE
Ketones, ur: NEGATIVE
Nitrite: NEGATIVE
Protein, ur: NEGATIVE
Specific Gravity, Urine: 1.015
pH: 7

## 2011-07-02 LAB — URINE MICROSCOPIC-ADD ON

## 2011-07-02 LAB — URINE CULTURE: Special Requests: NEGATIVE

## 2011-07-02 LAB — PROTIME-INR
INR: 1.4
Prothrombin Time: 17.1 — ABNORMAL HIGH

## 2011-07-02 LAB — CATH TIP CULTURE: Culture: NO GROWTH

## 2011-07-02 LAB — CARDIAC PANEL(CRET KIN+CKTOT+MB+TROPI)
CK, MB: 2.7
CK, MB: 4.1 — ABNORMAL HIGH
Relative Index: INVALID
Total CK: 171
Total CK: 38
Total CK: 97
Troponin I: 0.03
Troponin I: 0.04
Troponin I: 0.04
Troponin I: 0.05
Troponin I: 0.11 — ABNORMAL HIGH

## 2011-07-02 LAB — CORTISOL: Cortisol, Plasma: 43.5

## 2011-07-02 LAB — WOUND CULTURE

## 2011-07-02 LAB — CULTURE, BLOOD (ROUTINE X 2)

## 2011-07-02 LAB — VANCOMYCIN, TROUGH
Vancomycin Tr: 11.1
Vancomycin Tr: <5 — ABNORMAL LOW

## 2011-07-02 LAB — DIFFERENTIAL
Basophils Absolute: 0
Basophils Relative: 0
Eosinophils Relative: 1
Lymphocytes Relative: 37
Monocytes Absolute: 0.9

## 2011-07-02 LAB — MAGNESIUM: Magnesium: 1.7

## 2011-07-02 LAB — D-DIMER, QUANTITATIVE
D-Dimer, Quant: 1.37 — ABNORMAL HIGH
D-Dimer, Quant: 4.3 — ABNORMAL HIGH

## 2011-07-02 LAB — APTT: aPTT: 102 — ABNORMAL HIGH

## 2011-07-05 LAB — POCT I-STAT 4, (NA,K, GLUC, HGB,HCT)
Glucose, Bld: 233 — ABNORMAL HIGH
HCT: 35 — ABNORMAL LOW
Hemoglobin: 11.9 — ABNORMAL LOW
Operator id: 122891
Potassium: 4.2
Sodium: 137
Sodium: 138

## 2011-07-05 LAB — CBC
MCHC: 33.1
RDW: 14.1

## 2011-07-05 LAB — BASIC METABOLIC PANEL
CO2: 31
Calcium: 8.8
Calcium: 9.2
Creatinine, Ser: 0.37 — ABNORMAL LOW
GFR calc Af Amer: >60
GFR calc Af Amer: >60
GFR calc non Af Amer: >60
Glucose, Bld: 189 — ABNORMAL HIGH
Sodium: 136

## 2011-07-06 LAB — DIFFERENTIAL
Basophils Absolute: 0
Basophils Relative: 0
Eosinophils Absolute: 0 — ABNORMAL LOW
Monocytes Absolute: 0.5
Monocytes Relative: 5
Neutro Abs: 7.4
Neutrophils Relative %: 80 — ABNORMAL HIGH

## 2011-07-06 LAB — CBC
MCHC: 33.4
MCV: 98.6
RBC: 4.27
RDW: 14

## 2011-07-06 LAB — TYPE AND SCREEN: Antibody Screen: NEGATIVE

## 2011-07-06 LAB — BASIC METABOLIC PANEL
Calcium: 9.8
Potassium: 4.7
Sodium: 136

## 2011-12-06 ENCOUNTER — Emergency Department: Payer: Self-pay | Admitting: Emergency Medicine

## 2011-12-06 LAB — CBC
HGB: 9.7 g/dL — ABNORMAL LOW (ref 12.0–16.0)
MCHC: 31.5 g/dL — ABNORMAL LOW (ref 32.0–36.0)
Platelet: 551 10*3/uL — ABNORMAL HIGH (ref 150–440)
RBC: 3.63 10*6/uL — ABNORMAL LOW (ref 3.80–5.20)
RDW: 17.6 % — ABNORMAL HIGH (ref 11.5–14.5)
WBC: 8.1 10*3/uL (ref 3.6–11.0)

## 2011-12-06 LAB — URINALYSIS, COMPLETE
Bilirubin,UR: NEGATIVE
Glucose,UR: NEGATIVE mg/dL (ref 0–75)
Ketone: NEGATIVE
RBC,UR: 1 /HPF (ref 0–5)
Squamous Epithelial: NONE SEEN

## 2011-12-06 LAB — COMPREHENSIVE METABOLIC PANEL
Anion Gap: 8 (ref 7–16)
Bilirubin,Total: 0.3 mg/dL (ref 0.2–1.0)
Calcium, Total: 9.3 mg/dL (ref 8.5–10.1)
Chloride: 104 mmol/L (ref 98–107)
Co2: 31 mmol/L (ref 21–32)
EGFR (African American): >60
Osmolality: 284 (ref 275–301)
SGOT(AST): 21 U/L (ref 15–37)
SGPT (ALT): 17 U/L
Total Protein: 6.4 g/dL (ref 6.4–8.2)

## 2011-12-06 LAB — PROTIME-INR: Prothrombin Time: 15.1 secs — ABNORMAL HIGH (ref 11.5–14.7)

## 2011-12-08 LAB — URINE CULTURE

## 2012-01-11 ENCOUNTER — Inpatient Hospital Stay: Payer: Self-pay | Admitting: Surgery

## 2012-01-11 LAB — URINALYSIS, COMPLETE
Blood: NEGATIVE
Glucose,UR: 150 mg/dL (ref 0–75)
Ph: 7 (ref 4.5–8.0)
RBC,UR: 2 /HPF (ref 0–5)
Specific Gravity: 1.016 (ref 1.003–1.030)
Squamous Epithelial: 8
WBC UR: 2 /HPF (ref 0–5)

## 2012-01-11 LAB — CBC
HCT: 35.3 % (ref 35.0–47.0)
MCH: 25.4 pg — ABNORMAL LOW (ref 26.0–34.0)
MCHC: 31.2 g/dL — ABNORMAL LOW (ref 32.0–36.0)
RDW: 17.9 % — ABNORMAL HIGH (ref 11.5–14.5)

## 2012-01-11 LAB — BASIC METABOLIC PANEL
Calcium, Total: 8.3 mg/dL — ABNORMAL LOW (ref 8.5–10.1)
Chloride: 109 mmol/L — ABNORMAL HIGH (ref 98–107)
Co2: 25 mmol/L (ref 21–32)
Glucose: 218 mg/dL — ABNORMAL HIGH (ref 65–99)

## 2012-01-11 LAB — LIPASE, BLOOD: Lipase: 80 U/L (ref 73–393)

## 2012-01-11 LAB — COMPREHENSIVE METABOLIC PANEL
Albumin: 3.8 g/dL (ref 3.4–5.0)
Anion Gap: 11 (ref 7–16)
Bilirubin,Total: 0.8 mg/dL (ref 0.2–1.0)
Chloride: 103 mmol/L (ref 98–107)
Co2: 26 mmol/L (ref 21–32)
EGFR (African American): >60
SGOT(AST): 28 U/L (ref 15–37)

## 2012-01-11 LAB — CK TOTAL AND CKMB (NOT AT ARMC): CK-MB: 2.2 ng/mL (ref 0.5–3.6)

## 2012-01-11 LAB — TROPONIN I: Troponin-I: <0.02 ng/mL

## 2012-01-12 LAB — CBC WITH DIFFERENTIAL/PLATELET
Basophil #: 0 10*3/uL (ref 0.0–0.1)
Eosinophil #: 0 10*3/uL (ref 0.0–0.7)
Lymphocyte %: 15.6 %
MCH: 24.2 pg — ABNORMAL LOW (ref 26.0–34.0)
MCHC: 30.1 g/dL — ABNORMAL LOW (ref 32.0–36.0)
Monocyte #: 0.9 x10 3/mm (ref 0.2–0.9)
Neutrophil #: 5.2 10*3/uL (ref 1.4–6.5)
Neutrophil %: 71.8 %
RBC: 3.63 10*6/uL — ABNORMAL LOW (ref 3.80–5.20)
RDW: 17.4 % — ABNORMAL HIGH (ref 11.5–14.5)

## 2012-01-12 LAB — COMPREHENSIVE METABOLIC PANEL
Albumin: 2.7 g/dL — ABNORMAL LOW (ref 3.4–5.0)
Alkaline Phosphatase: 44 U/L — ABNORMAL LOW (ref 50–136)
EGFR (Non-African Amer.): >60
Glucose: 111 mg/dL — ABNORMAL HIGH (ref 65–99)
Osmolality: 285 (ref 275–301)
Sodium: 144 mmol/L (ref 136–145)

## 2012-01-12 LAB — PROTIME-INR: INR: 2

## 2012-01-13 LAB — BASIC METABOLIC PANEL
Anion Gap: 9 (ref 7–16)
Calcium, Total: 8.7 mg/dL (ref 8.5–10.1)
Chloride: 105 mmol/L (ref 98–107)
Co2: 25 mmol/L (ref 21–32)
EGFR (African American): >60
EGFR (Non-African Amer.): >60
Osmolality: 276 (ref 275–301)

## 2012-01-13 LAB — PROTIME-INR: Prothrombin Time: 20.5 secs — ABNORMAL HIGH (ref 11.5–14.7)

## 2012-07-05 ENCOUNTER — Ambulatory Visit: Payer: Self-pay

## 2012-07-20 ENCOUNTER — Emergency Department: Payer: Self-pay | Admitting: Internal Medicine

## 2012-07-26 ENCOUNTER — Emergency Department: Payer: Self-pay | Admitting: Internal Medicine

## 2012-07-26 LAB — COMPREHENSIVE METABOLIC PANEL
Albumin: 3.4 g/dL (ref 3.4–5.0)
Anion Gap: 9 (ref 7–16)
Calcium, Total: 8.9 mg/dL (ref 8.5–10.1)
Chloride: 105 mmol/L (ref 98–107)
Co2: 27 mmol/L (ref 21–32)
Creatinine: 0.69 mg/dL (ref 0.60–1.30)
Osmolality: 281 (ref 275–301)
Sodium: 141 mmol/L (ref 136–145)

## 2012-07-26 LAB — URINALYSIS, COMPLETE
Ketone: NEGATIVE
Nitrite: POSITIVE
RBC,UR: 11 /HPF (ref 0–5)
Specific Gravity: 1.027 (ref 1.003–1.030)
WBC UR: 118 /HPF (ref 0–5)

## 2012-07-26 LAB — CBC
HGB: 10.1 g/dL — ABNORMAL LOW (ref 12.0–16.0)
MCH: 25.6 pg — ABNORMAL LOW (ref 26.0–34.0)
MCHC: 31.3 g/dL — ABNORMAL LOW (ref 32.0–36.0)
MCV: 82 fL (ref 80–100)
Platelet: 466 10*3/uL — ABNORMAL HIGH (ref 150–440)

## 2012-07-26 LAB — PROTIME-INR
INR: 2.3
Prothrombin Time: 25.5 secs — ABNORMAL HIGH (ref 11.5–14.7)

## 2012-07-26 LAB — CK TOTAL AND CKMB (NOT AT ARMC)
CK, Total: 121 U/L (ref 21–215)
CK-MB: 4.2 ng/mL — ABNORMAL HIGH (ref 0.5–3.6)

## 2012-10-07 ENCOUNTER — Emergency Department: Payer: Self-pay | Admitting: Emergency Medicine

## 2012-10-07 LAB — CBC
HCT: 30.6 % — ABNORMAL LOW (ref 35.0–47.0)
MCH: 25.4 pg — ABNORMAL LOW (ref 26.0–34.0)
MCHC: 31.2 g/dL — ABNORMAL LOW (ref 32.0–36.0)
Platelet: 400 10*3/uL (ref 150–440)
RDW: 18 % — ABNORMAL HIGH (ref 11.5–14.5)

## 2012-10-07 LAB — URINALYSIS, COMPLETE
Blood: NEGATIVE
Glucose,UR: >=500 mg/dL (ref 0–75)
Ketone: NEGATIVE
Nitrite: POSITIVE
RBC,UR: 1 /HPF (ref 0–5)
Specific Gravity: 1.006 (ref 1.003–1.030)
Squamous Epithelial: 1
WBC UR: 18 /HPF (ref 0–5)

## 2012-10-07 LAB — BASIC METABOLIC PANEL
Calcium, Total: 9.1 mg/dL (ref 8.5–10.1)
Chloride: 108 mmol/L — ABNORMAL HIGH (ref 98–107)

## 2012-10-08 ENCOUNTER — Emergency Department: Payer: Self-pay | Admitting: Emergency Medicine

## 2012-10-11 LAB — URINE CULTURE

## 2013-02-27 ENCOUNTER — Emergency Department: Payer: Self-pay | Admitting: Internal Medicine

## 2013-02-27 LAB — CBC
HCT: 27.6 % — ABNORMAL LOW (ref 35.0–47.0)
HGB: 8.5 g/dL — ABNORMAL LOW (ref 12.0–16.0)
MCH: 22 pg — ABNORMAL LOW (ref 26.0–34.0)
MCHC: 30.7 g/dL — ABNORMAL LOW (ref 32.0–36.0)
MCV: 72 fL — ABNORMAL LOW (ref 80–100)
RBC: 3.85 10*6/uL (ref 3.80–5.20)

## 2013-02-27 LAB — BASIC METABOLIC PANEL
Anion Gap: 6 — ABNORMAL LOW (ref 7–16)
BUN: 10 mg/dL (ref 7–18)
Chloride: 106 mmol/L (ref 98–107)
Creatinine: 0.69 mg/dL (ref 0.60–1.30)
EGFR (Non-African Amer.): >60
Glucose: 94 mg/dL (ref 65–99)
Osmolality: 274 (ref 275–301)
Potassium: 3.9 mmol/L (ref 3.5–5.1)

## 2013-02-27 LAB — CK TOTAL AND CKMB (NOT AT ARMC)
CK, Total: 53 U/L (ref 21–215)
CK-MB: 2.1 ng/mL (ref 0.5–3.6)

## 2013-07-17 ENCOUNTER — Emergency Department: Payer: Self-pay | Admitting: Emergency Medicine

## 2013-07-17 LAB — CBC WITH DIFFERENTIAL/PLATELET
HCT: 31.4 % — ABNORMAL LOW (ref 35.0–47.0)
MCH: 27 pg (ref 26.0–34.0)
MCV: 84 fL (ref 80–100)
Platelet: 518 10*3/uL — ABNORMAL HIGH (ref 150–440)
RBC: 3.75 10*6/uL — ABNORMAL LOW (ref 3.80–5.20)
Segmented Neutrophils: 85 %
WBC: 7.8 10*3/uL (ref 3.6–11.0)

## 2013-07-17 LAB — PROTIME-INR: INR: 1.8

## 2013-07-17 LAB — BASIC METABOLIC PANEL
BUN: 8 mg/dL (ref 7–18)
Calcium, Total: 8.3 mg/dL — ABNORMAL LOW (ref 8.5–10.1)
Chloride: 103 mmol/L (ref 98–107)
Creatinine: 0.62 mg/dL (ref 0.60–1.30)
Potassium: 3.5 mmol/L (ref 3.5–5.1)

## 2013-07-17 LAB — URINALYSIS, COMPLETE
Bilirubin,UR: NEGATIVE
Glucose,UR: NEGATIVE mg/dL (ref 0–75)
Ph: 7 (ref 4.5–8.0)
Protein: NEGATIVE
Specific Gravity: 1.01 (ref 1.003–1.030)
Squamous Epithelial: <1
WBC UR: 14 /HPF (ref 0–5)

## 2013-11-30 ENCOUNTER — Observation Stay: Payer: Self-pay | Admitting: Internal Medicine

## 2013-11-30 LAB — BASIC METABOLIC PANEL
Anion Gap: 1 — ABNORMAL LOW (ref 7–16)
BUN: 7 mg/dL (ref 7–18)
CHLORIDE: 107 mmol/L (ref 98–107)
CREATININE: 0.41 mg/dL — AB (ref 0.60–1.30)
Calcium, Total: 8.5 mg/dL (ref 8.5–10.1)
Co2: 31 mmol/L (ref 21–32)
EGFR (Non-African Amer.): >60
Glucose: 93 mg/dL (ref 65–99)
Osmolality: 275 (ref 275–301)
Potassium: 4.6 mmol/L (ref 3.5–5.1)
Sodium: 139 mmol/L (ref 136–145)

## 2013-11-30 LAB — TROPONIN I
Troponin-I: <0.02 ng/mL
Troponin-I: <0.02 ng/mL

## 2013-11-30 LAB — CBC
HCT: 29.7 % — AB (ref 35.0–47.0)
HGB: 9.5 g/dL — AB (ref 12.0–16.0)
MCH: 26.1 pg (ref 26.0–34.0)
MCHC: 32 g/dL (ref 32.0–36.0)
MCV: 82 fL (ref 80–100)
Platelet: 478 10*3/uL — ABNORMAL HIGH (ref 150–440)
RBC: 3.64 10*6/uL — AB (ref 3.80–5.20)
RDW: 19.3 % — AB (ref 11.5–14.5)
WBC: 10.3 10*3/uL (ref 3.6–11.0)

## 2013-12-02 IMAGING — US US EXTREM LOW VENOUS*R*
1 series · 17 of 24 positions shown · non-contrast
Comparison: none

REASON FOR EXAM: right leg painful and red x 2 days       [HOSPITAL]
COMMENTS:

PROCEDURE:     US  - US DOPPLER LOW EXTR RIGHT  - December 06, 2011  [DATE]
RESULT:     The right femoral and popliteal veins are normally compressible.
The waveform patterns are normal in color flow images are normal. The
response to the augmentation maneuver is normal.

[Series 1: us extrem low venous*right* · 17 of 24 slices shown]
[im 1/24]
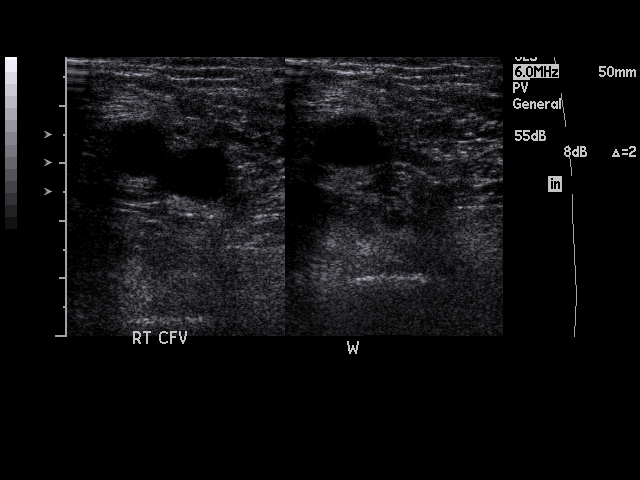
[im 3/24]
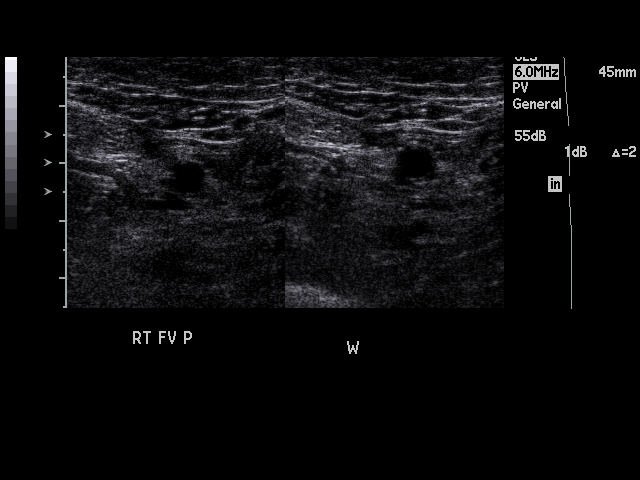
[im 4/24]
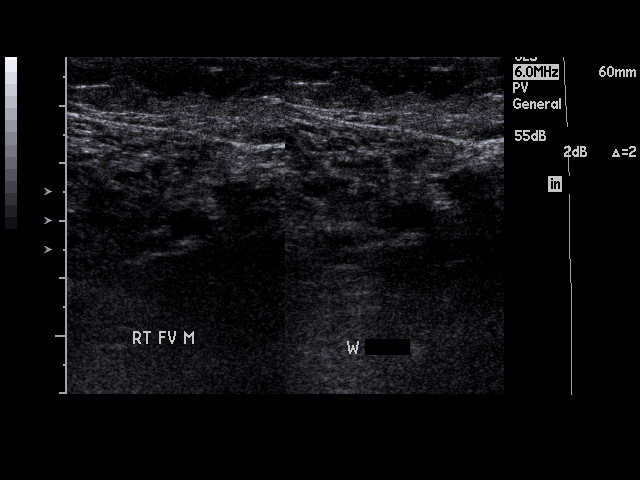
[im 5/24]
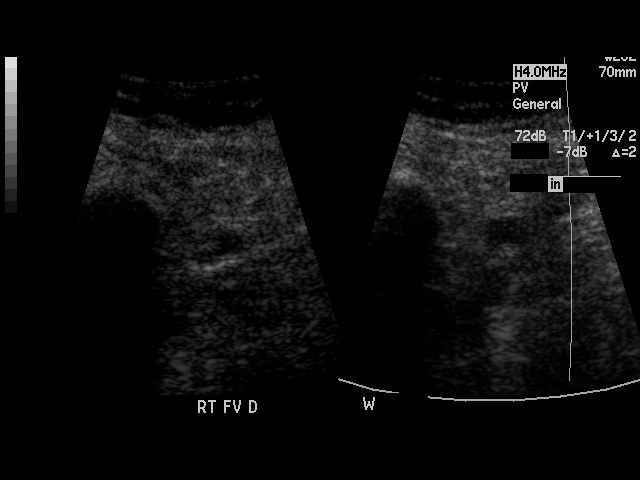
[im 7/24]
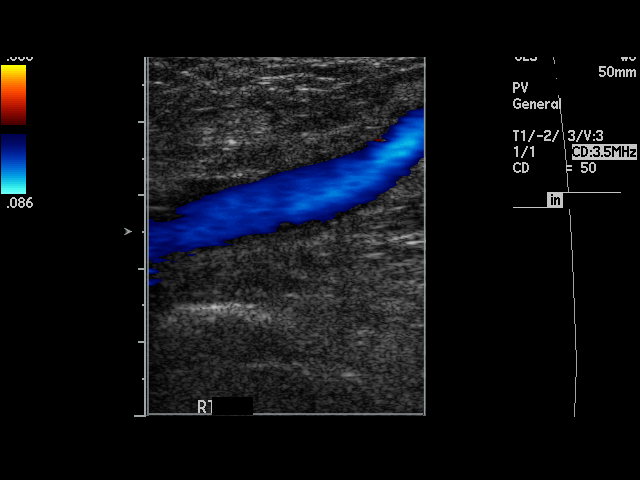
[im 8/24]
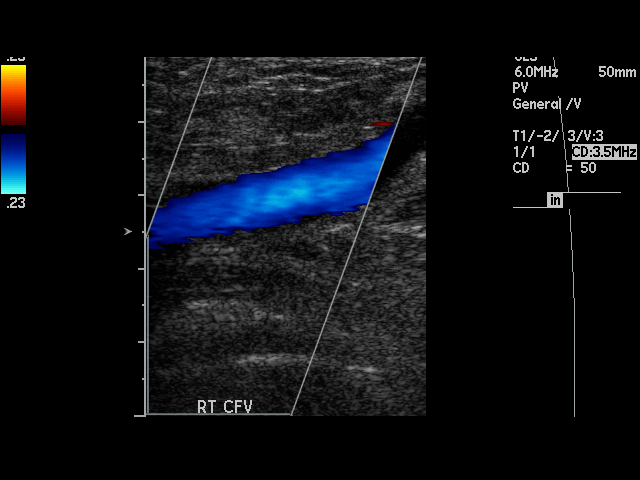
[im 10/24]
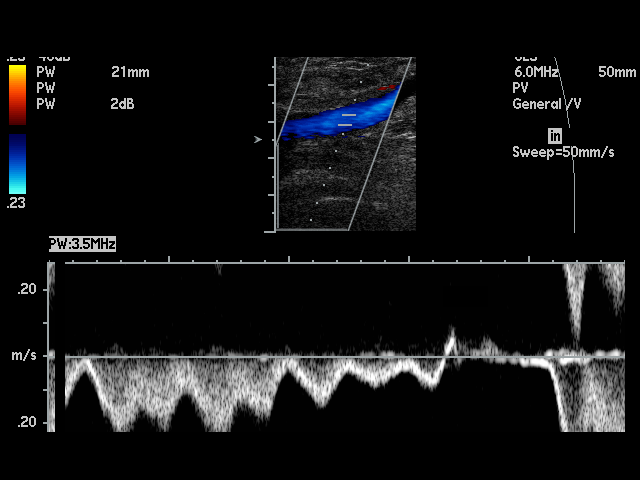
[im 11/24]
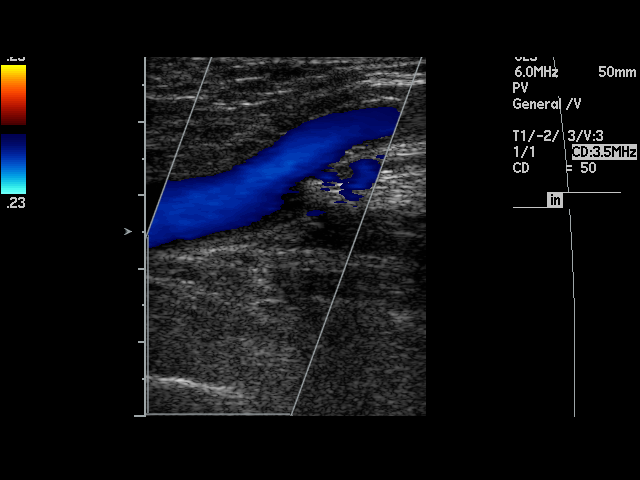
[im 13/24]
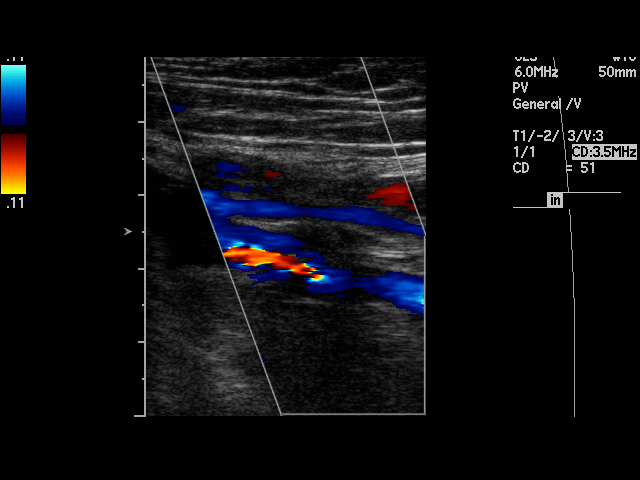
[im 14/24]
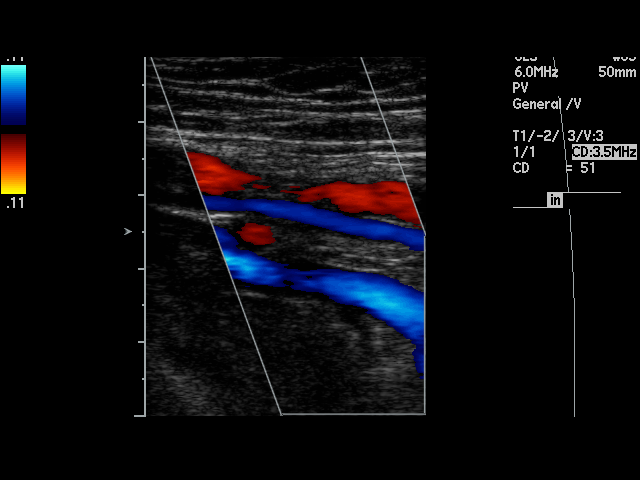
[im 15/24]
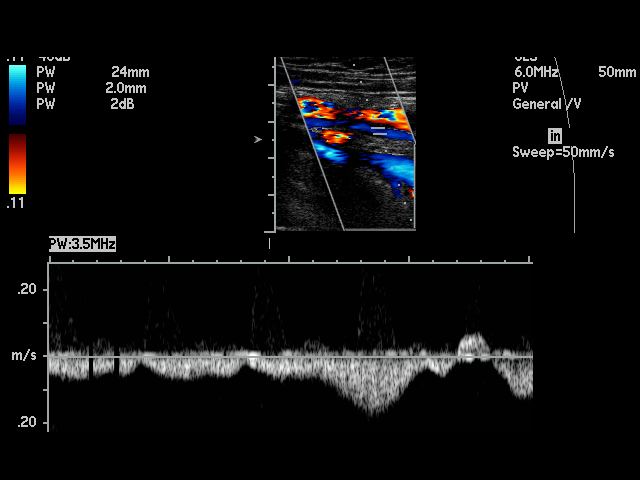
[im 17/24]
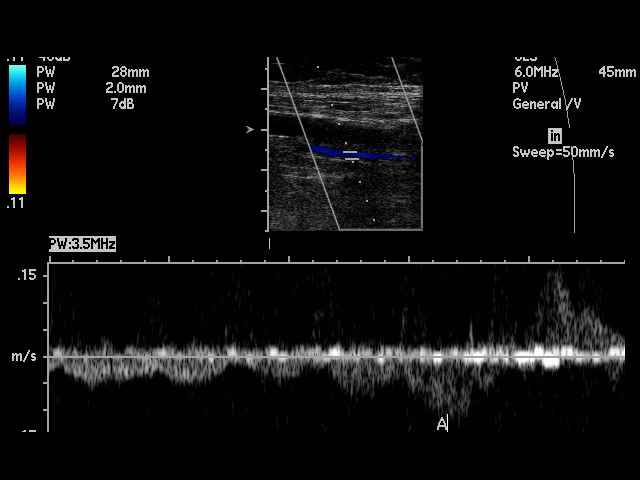
[im 18/24]
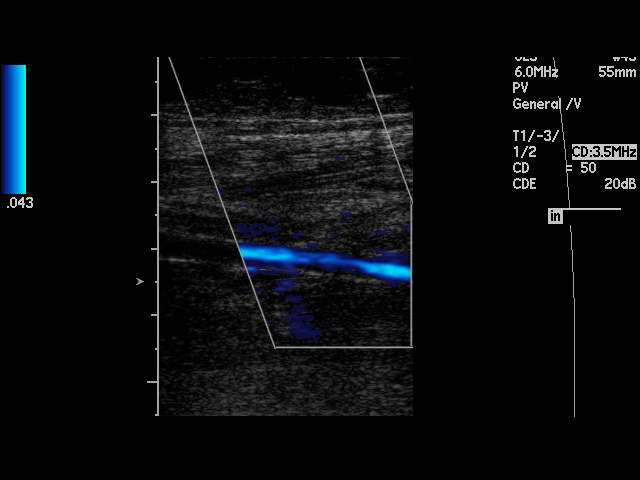
[im 20/24]
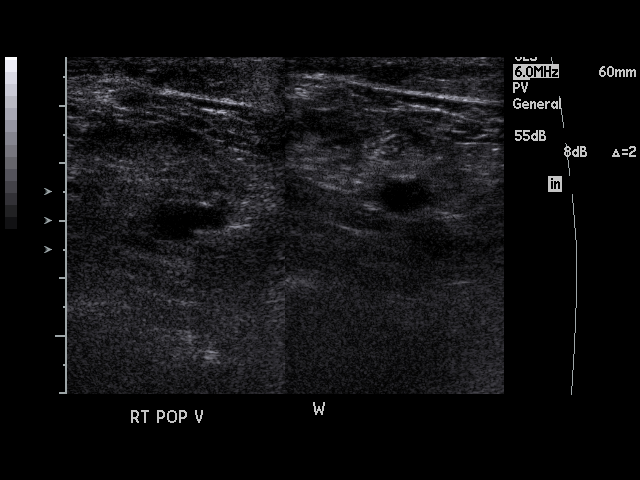
[im 21/24]
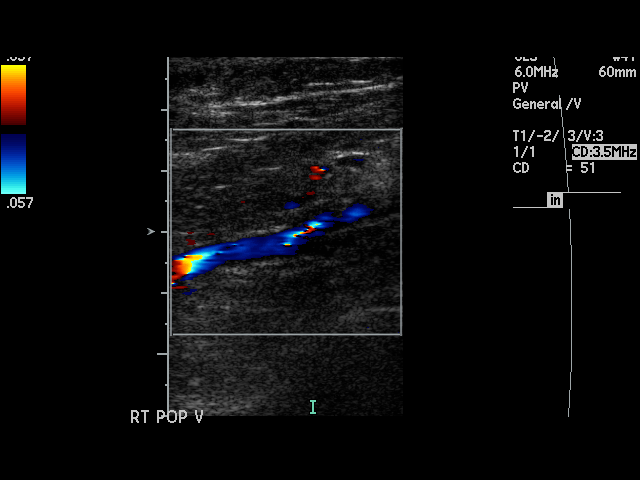
[im 22/24]
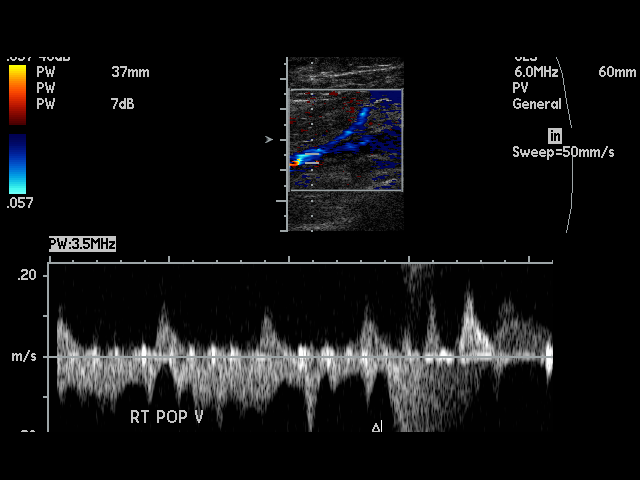
[im 24/24]
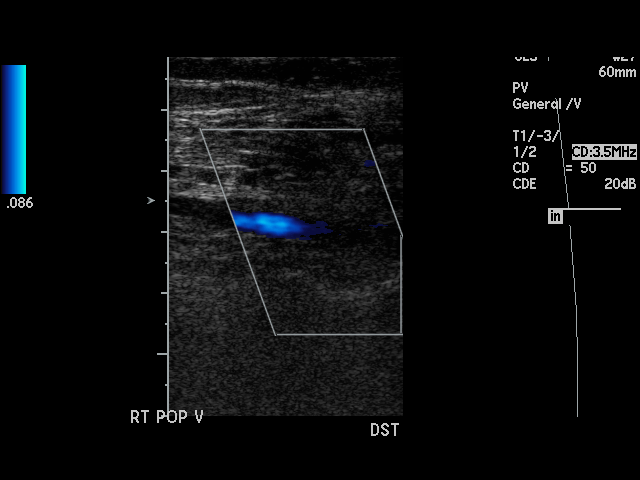

[17 of 24 positions shown; findings below may reference images not displayed]

IMPRESSION: I see no evidence of thrombus within the right femoral or
popliteal veins.

## 2013-12-02 IMAGING — CT CT ABD-PELV W/ CM
1 of 2 series · 14 of 32 positions shown, 18 images · non-contrast
Comparison: none

REASON FOR EXAM: (1) pain x 2-3 days; (2) right OSNICK pain x 2-3 days
COMMENTS:

[Series 2: 3mm soft tissue · axial · 0.67mm/px · z∈[-1022,-686]mm · 14 of 129 slices shown, 18 images]
[im 11/129  soft-tissue]
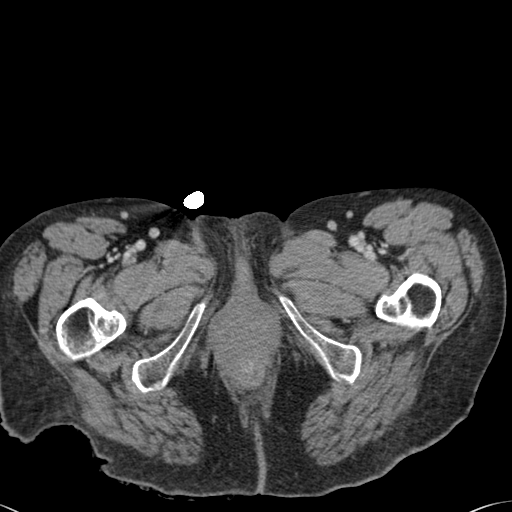
[im 11/129  bone]
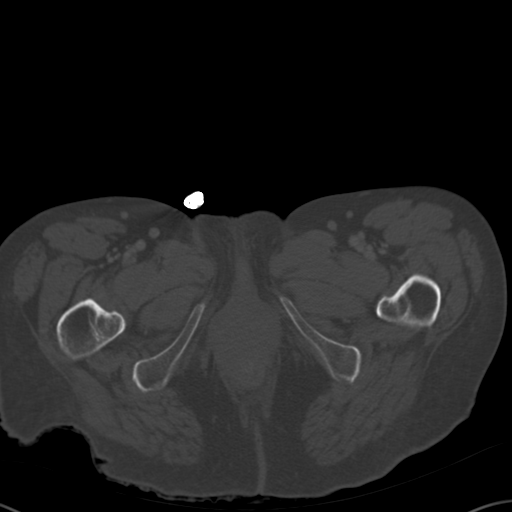
[im 21/129  soft-tissue]
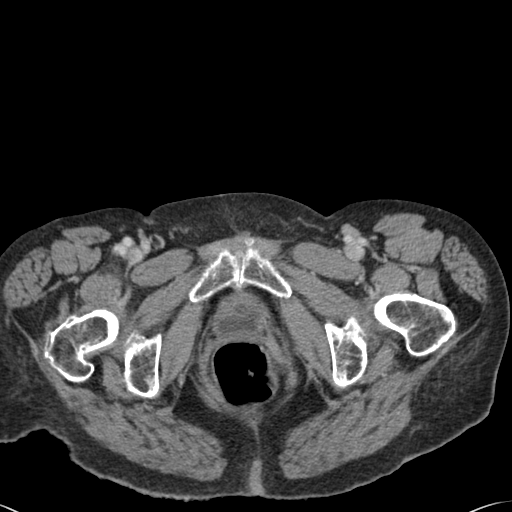
[im 31/129  soft-tissue]
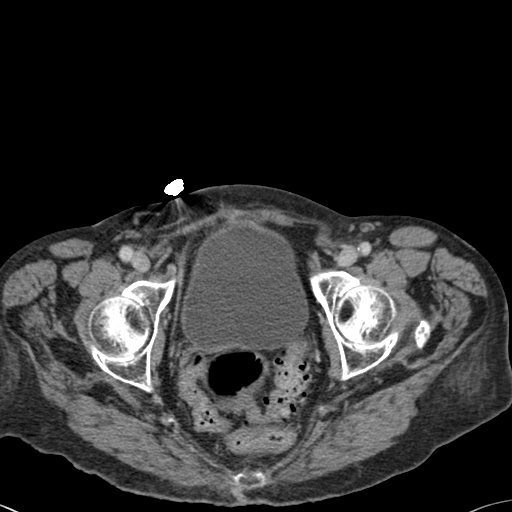
[im 41/129  soft-tissue]
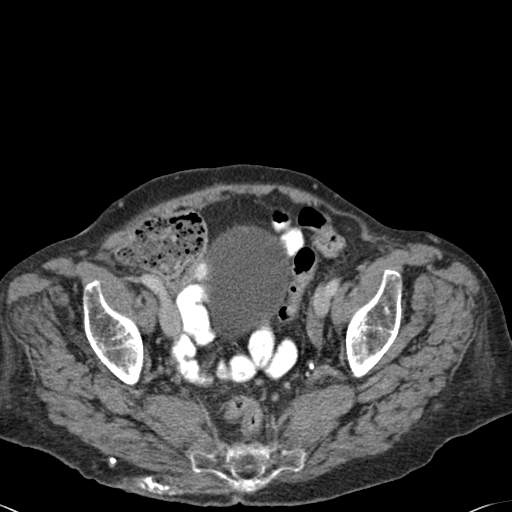
[im 52/129  soft-tissue]
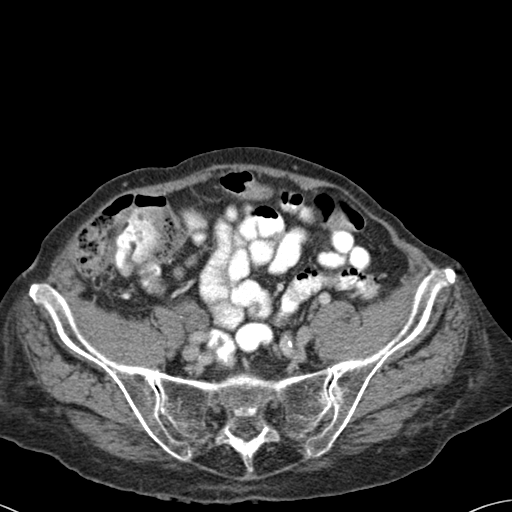
[im 62/129  soft-tissue]
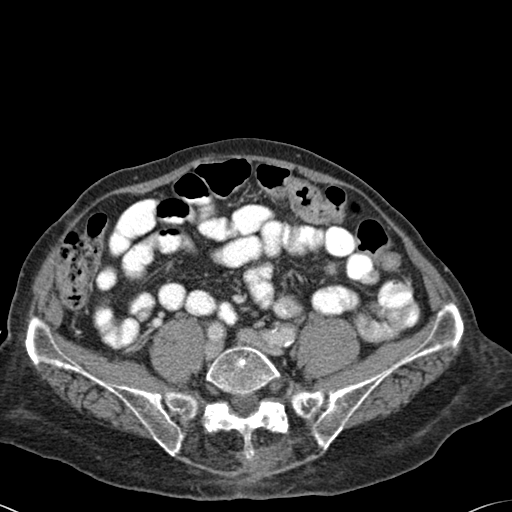
[im 72/129  soft-tissue]
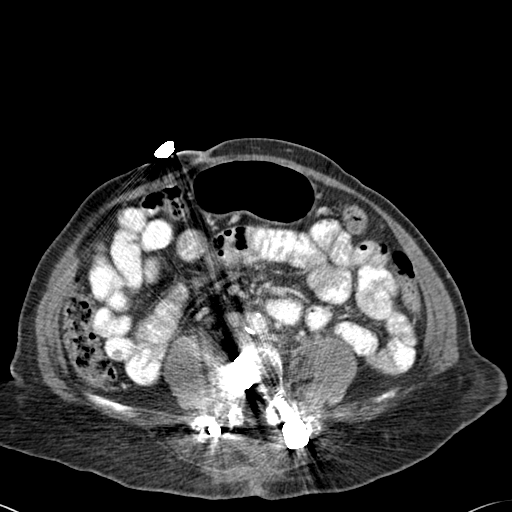
[im 82/129  soft-tissue]
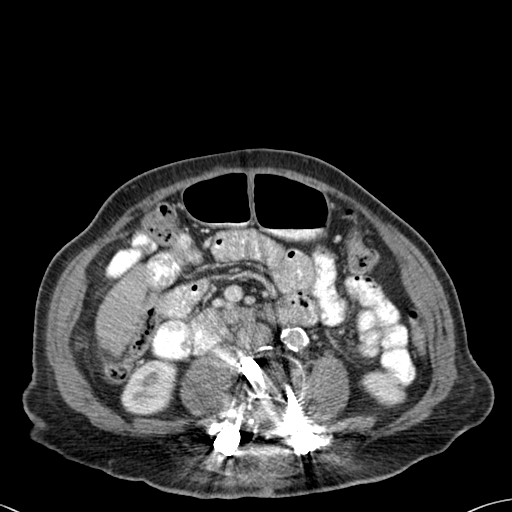
[im 93/129  soft-tissue]
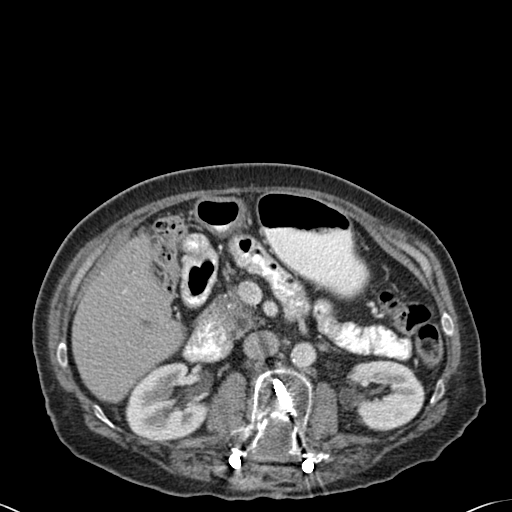
[im 93/129  bone]
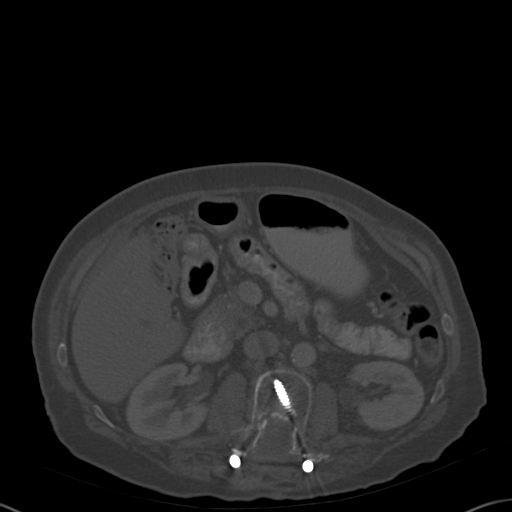
[im 103/129  soft-tissue]
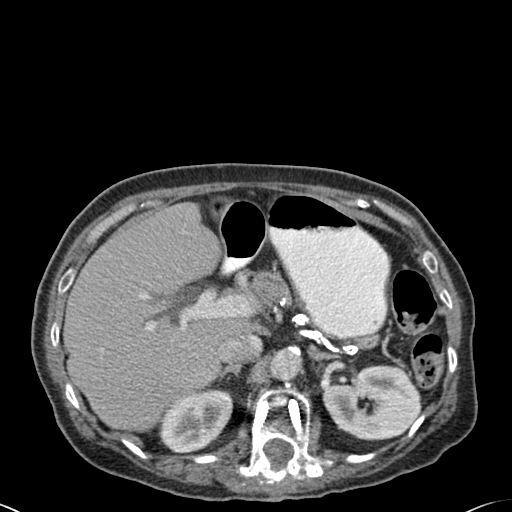
[im 108/129  lung]
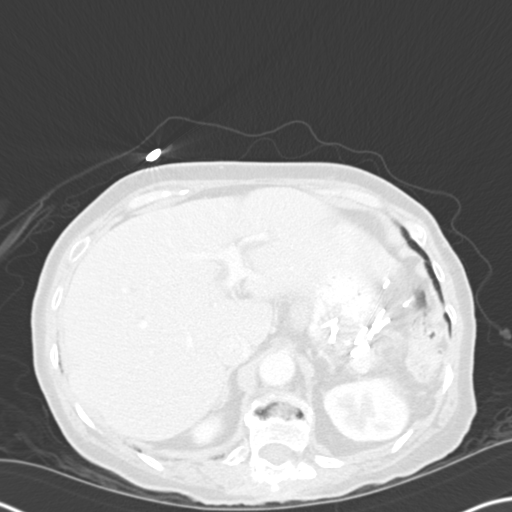
[im 113/129  soft-tissue]
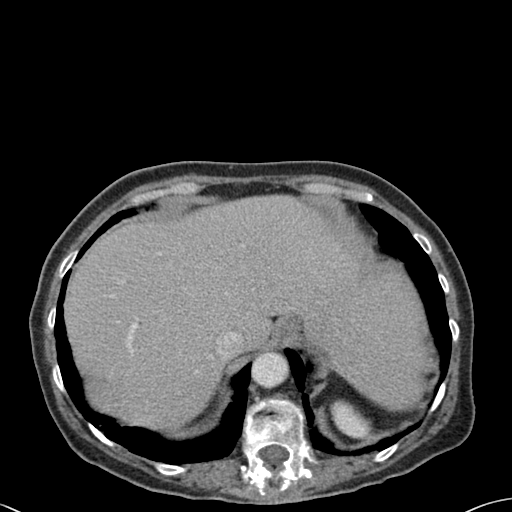
[im 113/129  lung]
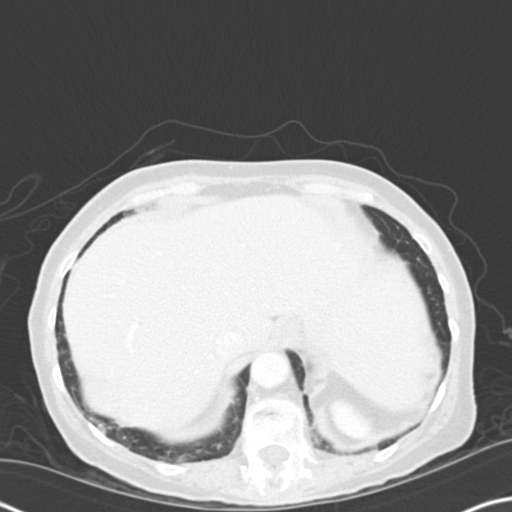
[im 118/129  lung]
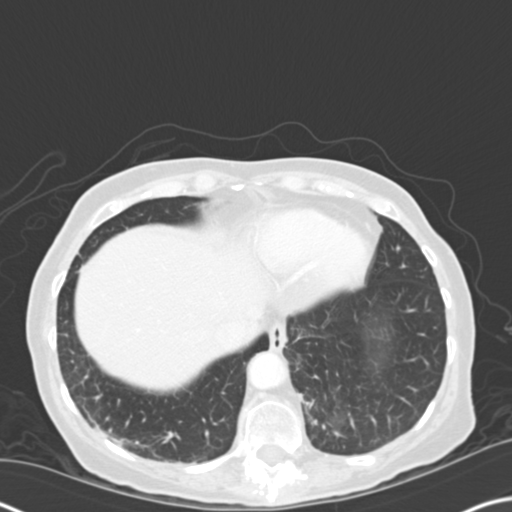
[im 123/129  soft-tissue]
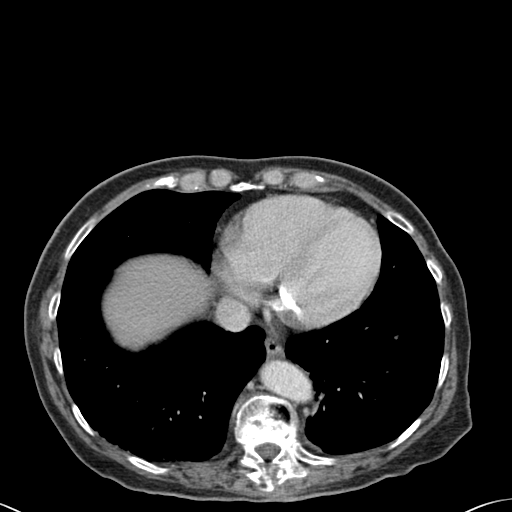
[im 123/129  lung]
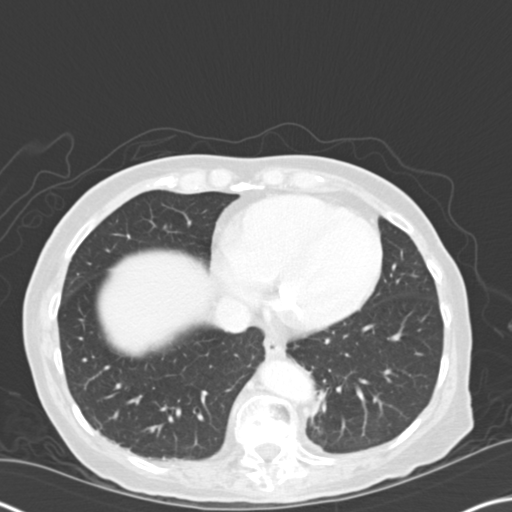

[14 of 32 positions shown; findings below may reference images not displayed]

PROCEDURE:     CT  - CT ABDOMEN / PELVIS  W  - December 06, 2011  [DATE]

RESULT:     Axial CT scanning was performed through the abdomen and pelvis
with reconstructions at 3 mm intervals and slice thicknesses. The patient
received 80 cc of Vsovue-PSS and also received oral contrast material.
Review of multiplanar reconstructed images was performed separately on the
VIA monitor. Comparison is made to a previous CT scan of the abdomen dated
June 23, 2011.

The patient has undergone cholecystectomy since the previous study.The liver
exhibits no focal mass. There is a small amount of intrahepatic ductal
dilation.  There is common bile ductal dilation is well. There is dense
calcification within the splenic artery. The patient has undergone previous
splenectomy. I do not see evidence of a pancreatic mass nor acute
pancreatitis. Metallic beam hardening artifact from the patient's lumbar
spine fixation hardware limits evaluation of the retroperitoneum. The
kidneys exhibit no evidence of obstruction. There is a stable 1 cm diameter
hypodensity in the midpole of the left kidney laterally most compatible with
a cyst. There are no adrenal masses.

The partially distended stomach is grossly normal. The partially
contrast-filled loops of small bowel exhibit no evidence of ileus nor of
obstruction. Contrast has just reached the cecum. There is a moderate amount
of stool within the ascending colon. Normal amounts of gas and stool are
seen in the transverse and descending portions of the colon. There is
sigmoid diverticulosis without objective evidence of acute diverticulitis.
The partially distended urinary bladder is normal in appearance. The uterus
is surgically absent. I see no inguinal nor umbilical hernia. The lung bases
exhibit minimal atelectasis versus fibrosis in the posterior costophrenic
gutter.
IMPRESSION: 1. There is a moderate amount of stool in the cecum and ascending colon.
This may be resulting in symptomatic constipation. I do not see evidence of
colitis or diverticulitis nor of bowel obstruction. The orally administered
contrast has only just reached the cecum.
2. There is no evidence of a small bowel obstruction.
3. Since the patient's cholecystectomy the intrahepatic biliary tree has
become mildly dilated and there is prominent dilation of the common bile
duct. I see no acute abnormality of the pancreas.
4. I do not see urinary tract obstruction.
5. The patient is undergone posterior fixation of the lumbar spine with
placement of intradiscal devices at multiple levels.

## 2014-12-31 ENCOUNTER — Ambulatory Visit
Admit: 2014-12-31 | Disposition: A | Discharge status: Home / Self Care | Payer: Self-pay | Attending: Internal Medicine | Admitting: Internal Medicine

## 2014-12-31 LAB — CANCER CTR PLATELET CT: Platelet: 411 x10 3/mm (ref 150–440)

## 2014-12-31 LAB — CANCER CENTER HEMOGLOBIN: HGB: 10 g/dL — AB (ref 12.0–16.0)

## 2014-12-31 LAB — FERRITIN: FERRITIN (ARMC): 9 ng/mL — AB

## 2015-01-18 NOTE — Discharge Summary (Signed)
PATIENT NAME:  [name], [name] MR#:  [id number] DATE OF BIRTH:  05-26-1936  DATE OF ADMISSION:  11/30/2013 DATE OF DISCHARGE:  12/01/2013   ADMITTING DIAGNOSIS: Chest pain.   DISCHARGE DIAGNOSES: 1.  Pleuritic chest pain, no pulmonary embolism, normal Myoview stress test, of unclear etiology at this time.  2.  Coronary artery disease.  3.  Chronic obstructive pulmonary disease according to CT scan.  4.  Hypertension.  5.  Hyperlipidemia.  6.  Diabetes mellitus. 7.  Hypothyroidism.  8.  Lower extremity deep vein thrombosis, on Xarelto. 9.  Depression.  10.  Gastroesophageal reflux disease.  11.  History of back pain as well as paraplegia as well as neuropathy.   DISCHARGE CONDITION: Stable.   DISCHARGE MEDICATIONS: The patient is to continue her outpatient medications which are:  1.  Metoprolol 25 mg twice daily. 2.  Timolol 0.5% 1 drop twice daily. 3.  Metformin 500 mg 2 tablets (which would be 1 gram) twice daily. The patient is to hold this medication for the next 48 hours after CT scan of her chest done today on the 7th of March 2015. 4.  Xarelto 10 mg p.o. daily.  5.  Oxybutynin extended-release 5 mg once daily.  6.  Phenergan 25 mg injectable solution every 6 hours as needed.  7.  Promethazine 25 mg every 6 hours as needed.  8.  Klor-Con 20 mEq daily.  9.  Lasix 40 mg p.o. daily.  10.  Loperamide 2 mg every 4 hours as needed.  11.  Mylanta 400/400/40 mg in 5 mL oral solution, 15 mL every 6 hours as needed.  12.  Mupirocin topical to affected area twice daily.  13.  Lyrica 100 mg p.o. 3 times daily.  14.  Cymbalta 30 mg 3 capsules once daily.  15.  Iron gluconate 240 mg p.o. daily.  16.  Glipizide 10 mg p.o. daily.  17.  Colace 100 mg p.o. at bedtime.  18.  Biofreeze 1 application daily.  19.  Norco 325/10 mg every 6 hours as needed.  20.  Multivitamins 1 daily.  21.  Omeprazole 20 mg p.o. daily.  22.  Vitamin B12, 500 mcg p.o. daily.  23.  Vitamin D3, 50,000 units  p.o. monthly.  24.  Remeron 15 mg p.o. at bedtime.  25.  Imdur 30 mg p.o. daily dose.  HOME HEALTH: None.   HOME OXYGEN: None.   DIET: 2 gram salt, low-fat, low-cholesterol, carbohydrate-controlled diet, regular consistency.   ACTIVITY LIMITATIONS: As tolerated.    FOLLOWUP APPOINTMENT: With Dr. Caryn Section in 2 days after discharge. The patient was advised to drink plenty of fluids so she will have good urinary output and again, she was recommended not to take metformin for the next 48 hours after this procedure after testing today on the 7th of March 2015. The patient's caregiver was also recommended to follow her bottom skin for skin breakdown due to her urinary incontinence.   CONSULTANTS: Care management, social work.   RADIOLOGIC STUDIES: Chest x-ray, portable single view, 6th of March 2015, showed vague area of increased density peripherally in the mid right hemithorax. Differential considerations are atelectasis versus scarring versus mild infiltrate. Repeated dedicated PA and lateral views were recommended of the chest. Chest, PA and lateral, 6th of March 2015, revealed no acute disease. CT scan of chest with IV contrast, 7th of March 2015, showed no evidence of pulmonary embolism. Areas of coarse reticular and patchy opacity in the posterior, inferior, lateral right upper  lobe and the medial left upper lobe, which are new since prior CT; these may reflect areas of inflammatory or infectious infiltrate; it could be atelectasis and/or scarring. No evidence of acute abnormality. Moderate centrilobular emphysema. Other chronic changes as detailed, as reported by radiology.   Nuclear medicine study Myoview stress test, 7th of March 2015, revealing no significant wall motion abnormalities. Pharmacological myocardial perfusion study with region of mild ischemia in the apical territory noted, with estimated ejection fraction of 53%. There are no EKG changes concerning for ischemia. There is no  artifact noted on this study. Read by Dr. Saralyn Pilar.   The patient is to follow up with her cardiologist, Dr. Saralyn Pilar, in the next few days after discharge.   HOSPITAL COURSE: The patient is a 79 year old Caucasian female who presented to the hospital with complaints of left-sided chest pain. Please refer to Dr. Boykin Reaper admission note on the 6th of March 2015. Apparently the patient had under left breast chest pain, which she noticed when she was waking up. It was getting worse whenever she took a deep breath or moved her left arm. There was no radiation or other aggravating or alleviating factors. She was admitted to the hospital for further evaluation.   On arrival to the Emergency Room, the patient's vital signs were remarkable for normal vital signs with normal blood pressure of 126/77, normal oxygenation of 95% on room air. Physical exam was unremarkable. The patient's EKG showed right bundle branch block, but no acute ST-T changes were noted. The patient's lab data done on admission were remarkable for normal BMP. Troponins x 3 were within normal limits. The patient's CBC: White blood cell count was 10.3; hemoglobin was 9.5; platelet count was 478. The patient was admitted to the hospital for further evaluation. Her cardiac enzymes were cycled, and Myoview stress test was ordered for the 7th of March 2015. Myoview stress test came back positive for no wall motion abnormalities. Also, mild ischemia in apical territory was noted; however, no active artifacts were noted or any other abnormalities. The patient underwent also CT scan of her chest since her presentation was concerning for pulmonary embolism. Despite her use of Xarelto, it was still felt that the patient may develop pulmonary embolism. However, CT scan of her chest did not show any evidence of pulmonary embolism. However, only coarse reticular as well as patchy opacities in right upper lobe as well as left upper lobe were noted, which were  somewhat new, and emphysema was also noted. The patient was felt to have chest pain of unclear etiology at this point. However, her pain did not recur, and it was felt that she would be stable to be discharged back to her facility. She is to resume her outpatient medications as well as Imdur, which was prescribed for her upon discharge. The patient is to follow up with cardiologist, Dr. Saralyn Pilar, for further recommendations.   In regards to lung scarring versus infection, it was felt that patient did not have a pneumonia. She was afebrile. She had also no elevation of white blood cell count. She, however, was recommended to be followed very closely for recurrences of her pain as well as possible infectious etiology of her lung changes.   She is being discharged in stable condition with the above-mentioned medications and followup.   TIME SPENT: 40 minutes.   ____________________________ Theodoro Grist, MD rv:jcm D: 12/01/2013 15:13:02 ET T: 12/01/2013 16:02:32 ET JOB#: [id number]  cc: Theodoro Grist, MD, <Dictator> Kirstie Peri. Caryn Section, MD  Isaias Cowman, MD  Theodoro Grist MD ELECTRONICALLY SIGNED 12/26/2013 21:40

## 2015-01-18 NOTE — H&P (Signed)
PATIENT NAME:  Victoria, [name] MR#:  [id number] DATE OF BIRTH:  05/26/1936  DATE OF ADMISSION:  11/30/2013  PRIMARY CARE PHYSICIAN: Dr. Caryn Section.  PRIMARY CARDIOLOGIST: Dr. Clayborn Bigness.   CHIEF COMPLAINT: Left-sided chest pain.   HISTORY OF PRESENT ILLNESS: A 79 year old Caucasian female patient with history of CAD, history of DVT on Xarelto who presents from assisted-living facility with complaints of left-sided chest pain under her breast which she noticed on waking up. The patient mentions that this pain seems to get worse when she takes a deep breath or moves her left arm. She did not have any radiation. No other aggravating or alleviating factors. The patient did have a NSTEMI in 2012 when she had a CAT with elevated troponin. Back then, her pain was at a similar site but was radiating to left arm and more intense.   Today in the Emergency Room, the patient has had an unchanged EKG, troponin normal, but is being admitted to rule out acute coronary syndrome considering her risk factors.   PAST MEDICAL HISTORY:  1.  Hypertension.  2.  Hyperlipidemia.  3.  Type 2 diabetes mellitus.  4.  Multiple right lower extremity DVTs, on Xarelto.  5.  Irregular heart beat.  6.  Multiple back operations.  7.  Arachnoid cyst.  8.  CSF fistula. 9.  MRSA bacteremia.  10.  ITP.  11.  Colon polyps.  12.  Hypothyroidism.  13.  Glaucoma.  14.  Depression.  15.  Cystitis.  16.  GERD.  17.  CAD, status post cardiac catheterization in April 2012, which showed EF of 40% with anterior apical hypokinesis. Left marginal okay. LAD 25% to 50% diffuse.  circumflex OM 100%.  RCA is small and nondominant and medical therapy was advised.  18.  Diffuse lower extremity neuropathy.  19.  Chronic anemia.  20.  Vitamin D deficiency.   FAMILY HISTORY: Her mother died of head and neck carcinoma. Father died of cerebral hemorrhage.   SOCIAL HISTORY: The patient does not smoke. No alcohol. No illicit drugs. Presently is  a resident of [facility]. She is bedbound and moves around in a wheelchair.   CODE STATUS: FULL CODE.   REVIEW OF SYSTEMS:  CONSTITUTIONAL: No fever, fatigue, weakness.  EYES: No blurred vision, pain or redness.  ENT: No tinnitus, ear pain or hearing loss.  RESPIRATORY: No cough, wheeze, or hemoptysis.  CARDIOVASCULAR: Has left-sided chest pain.  GASTROINTESTINAL: No nausea, vomiting, diarrhea, abdominal pain.  GENITOURINARY: No dysuria, hematuria, frequency.  ENDOCRINE: No polyuria, nocturia, or thyroid problems.  HEMOLYMPHATIC: No anemia, easy bruising, bleeding.  INTEGUMENTARY: No acne, rash, or lesion.  MUSCULOSKELETAL: Generalized weakness. She is bedbound.  NEUROLOGIC: No focal numbness, weakness, seizure.  PSYCHIATRIC: No anxiety or depression.   HOME MEDICATIONS:  1.  Colace 100 mg oral once a day.  2.  Cymbalta 30 mg three capsules oral once a day.  3.  Ferrous gluconate 240 mg oral once a day.  4.  Glipizide 10 mg daily.  5.  Potassium chloride 20 mEq daily.  6.  Lasix 40 mg daily.  7.  Loperamide 2 mg oral every 4 hours as needed.  8.  Lyrica 100 mg oral 3 times a day.  9.  Metformin 500 mg two tablets oral 2 times a day.  10.  Metoprolol tartrate 25 mg oral 2 times a day.  11.  Multivitamin 1 tablet daily.  12.  Mylanta 15 mL every 6 hours as needed.  13.  Norco 325/10 one tablet oral every 6 hours as needed.  14.  Prilosec 20 mg daily.  15.  Oxybutynin 5 mg oral daily.  16.  Claritin 25 mg every 6 hours as needed for nausea or vomiting.  17.  Remeron 15 mg at bedtime.  18.  Vitamin B12 at 500 mcg oral once a day.  19.  Vitamin D3 at 50,000 international units oral once a month.  20.  Xarelto 20 mg oral once a day.   PHYSICAL EXAMINATION:  VITAL SIGNS: Temperature 98.4, pulse of 73, blood pressure 126/77, saturating 95% on room air.  GENERAL: Moderately built Caucasian female patient lying in bed, seems comfortable,  conversational, cooperative with exam.  PSYCHIATRIC: Alert, oriented x3. Mood and affect appropriate. Judgment intact.  HEENT: Atraumatic, normocephalic. Oral mucosa moist and pink. External ears and nose normal. Pallor positive. No icterus. Pupils are reactive to light.  NECK: Supple without any thyromegaly or palpable lymph nodes. Trachea midline. No carotid bruit, JVD.  CARDIOVASCULAR: S1, S2, without any murmurs. Peripheral pulses 2+. No edema.  RESPIRATORY: Normal work of breathing. Clear to auscultation on both sides. Chest tenderness in the left chest.  GASTROINTESTINAL: Soft abdomen, nontender. Bowel sounds present. No hepatosplenomegaly palpable.  SKIN: Warm and dry. No petechiae, rash or ulcers.  MUSCULOSKELETAL: No joint swelling, redness, effusion of the large joints. Normal muscle tone.   NEUROLOGICAL: Motor strength 5/5 in upper and lower extremities. Sensation is intact all over.  LYMPHATIC: No cervical lymphadenopathy.   LABORATORY STUDIES: Glucose 157, BUN 7, creatinine 0.41, sodium 139, potassium 4.6. Troponin less than 0.02. WBC 10.3, hemoglobin 9.5, platelets of 478.  EKG shows right bundle branch block. No acute ST-T wave changes.  Chest x-ray shows no acute abnormalities.   ASSESSMENT AND PLAN:  1.  Left-sided chest pain. This seems to have both typical and atypical features. Considering her risk factors, will admit patient onto a telemetry floor. Get a stress test in the morning. I suspect the stress test will likely be abnormal considering her last cardiac catheterization report, but only medical therapy was advised the last time. If the chest x-ray is abnormal, she will need cardiology consultation. We will continue her coronary artery disease medications at this time.  2.  Hypertension. Will continue medications.  3.  Diabetes mellitus. Continue home medications and sliding-scale insulin.  4.  Deep vein prophylaxis. The patient is on Xarelto.   TIME SPENT TODAY ON  THIS CASE:  40 minutes.    ____________________________ Leia Alf Jameison Haji, MD srs:np D: 11/30/2013 18:20:12 ET T: 11/30/2013 19:33:18 ET JOB#: [id number]  cc: Alveta Heimlich R. Jeanmarie Mccowen, MD, <Dictator> Dwayne D. Clayborn Bigness, MD Kirstie Peri Caryn Section, MD Mikeal Hawthorne Brynda Greathouse, MD Neita Carp MD ELECTRONICALLY SIGNED 12/12/2013 15:49

## 2015-01-19 NOTE — H&P (Signed)
Subjective/Chief Complaint n/v    History of Present Illness had mult emesis without abd pain then fell and now has rt hip and side pain, min abd pain only after fall. no flatus, no bm. no prior episode. no f/c. waas in ED one month ago with n/v. rx'd for UTI.    Past History MI, CAD Multiple DVT episodes, on coumadin DM, HTN biliary pancreatitis back probs, stenosis with chronic back pain PSH: TAH, GB appy, splenectomy    Past Medical Health Coronary Artery Disease, Hypertension, Diabetes Mellitus   Past Med/Surgical Hx:  Cystitis:   vitamin d defiency:   neuropathy:   Hypertension:   Osteoporosis:   Hyperlipidemia:   migraines:   gerd:   kidney stones:   depression:   dizziness:   arthritis:   Irregular heart beat:   Diabetes:   Immune Thrombocytopenic Purpura:   Hypothyroidism:   Glaucoma:   Tonsillectomy:   Bladder Surgery:   Splenectomy:   Back Surgery: times 2  Hysterectomy - Total:   Appendectomy:   ALLERGIES:  Dilaudid: Alt Ment Status  Fentanyl: Alt Ment Status  Sulfa drugs: N/V, Hives  Percocet: N/V  Codeine: Itching, Rash, Other  Latex: Rash  Ultram: Other  Family and Social History:   Family History Non-Contributory    Place of Living Nursing Home   Review of Systems:   Fever/Chills No    Cough No    Abdominal Pain Yes    Diarrhea No    Constipation Yes    Nausea/Vomiting Yes    SOB/DOE No    Chest Pain No    Dysuria No    Tolerating Diet No  Nauseated  Vomiting   Physical Exam:   GEN disheveled, uncomfortable    HEENT pink conjunctivae    RESP normal resp effort  clear BS    CARD regular rate    ABD denies tenderness  soft  distended  tympanitic, nontender, no peritoneal signs, scars    LYMPH negative neck    EXTR negative edema    PSYCH alert, A+O to time, place, person, good insight   Cardiac:  16-Apr-13 00:13    CK, Total 100   CPK-MB, Serum 2.2  Routine Hem:  16-Apr-13 00:13    WBC (CBC) 19.9   RBC  (CBC) 4.34   Hemoglobin (CBC) 11.0   Hematocrit (CBC) 35.3   Platelet Count (CBC) 473   MCV 81   MCH 25.4   MCHC 31.2   RDW 17.9  Routine Chem:  16-Apr-13 00:13    Lipase 80   Glucose, Serum 196   BUN 13   Creatinine (comp) 0.55   Sodium, Serum 140   Potassium, Serum 4.2   Chloride, Serum 103   CO2, Serum 26   Calcium (Total), Serum 8.9  Hepatic:  16-Apr-13 00:13    Bilirubin, Total 0.8   Alkaline Phosphatase 62   SGPT (ALT) 22   SGOT (AST) 28   Total Protein, Serum 7.2   Albumin, Serum 3.8  Routine Chem:  16-Apr-13 00:13    Osmolality (calc) 285   eGFR (African American) >60   eGFR (Non-African American) >60   Anion Gap 11  Cardiac:  16-Apr-13 00:13    Troponin I < 0.02  Routine Coag:  16-Apr-13 00:13    Prothrombin 28.1   INR 2.6  Routine UA:  16-Apr-13 00:54    Color (UA) Yellow   Clarity (UA) Hazy   Bilirubin (UA) Negative   Ketones (UA) 1+  Specific Gravity (UA) 1.016   Blood (UA) Negative   pH (UA) 7.0   Protein (UA) Negative   Nitrite (UA) Negative   Leukocyte Esterase (UA) Negative   RBC (UA) 2 /HPF   WBC (UA) 2 /HPF   Bacteria (UA) TRACE   Epithelial Cells (UA) 8 /HPF   Radiology Results: CT:    11-Mar-13 21:55, CT Abdomen and Pelvis With Contrast   CT Abdomen and Pelvis With Contrast   REASON FOR EXAM:    (1) pain x 2-3 days; (2) right LQ pain x 2-3 days  COMMENTS:       PROCEDURE: CT  - CT ABDOMEN / PELVIS  W  - Dec 06 2011  9:55PM     RESULT: Axial CT scanning was performed through the abdomen and pelvis   with reconstructions at3 mm intervals and slice thicknesses. The patient   received 80 cc of Isovue-300 and also received oral contrast material.   Review of multiplanar reconstructed images was performed separately on   the VIA monitor. Comparison is made to a previous CTscan of the abdomen   dated June 23, 2011.    The patient has undergone cholecystectomy since the previous study.The   liver exhibits no focal mass. There  is a small amount of intrahepatic   ductal dilation.  There is common bile ductal dilationis well. There is     dense calcification within the splenic artery. The patient has undergone   previous splenectomy. I do not see evidence of a pancreatic mass nor   acute pancreatitis. Metallic beam hardening artifact from the patient's   lumbar spine fixation hardware limits evaluation of the retroperitoneum.   The kidneys exhibit no evidence of obstruction. There is a stable 1 cm   diameter hypodensity in the midpole of the left kidney laterally most   compatible with a cyst. There are no adrenal masses.     The partially distended stomach is grossly normal. The partially   contrast-filled loops of small bowel exhibit no evidence of ileus nor of   obstruction. Contrast has just reached the cecum. There is a moderate   amount of stool within the ascending colon. Normal amounts of gas and   stool are seen in the transverse and descending portions of the colon.   There is sigmoid diverticulosis without objective evidence of acute   diverticulitis. The partially distended urinary bladderis normal in     appearance. The uterus is surgically absent. I see no inguinal nor   umbilical hernia. The lung bases exhibit minimal atelectasis versus   fibrosis in the posterior costophrenic gutter.    IMPRESSION:   1. There is a moderate amount of stool in the cecum and ascending colon.   This may be resulting in symptomatic constipation. I do not see evidence   of colitis or diverticulitis nor of bowel obstruction. The orally   administered contrast has only just reached the cecum.  2. There is no evidence of a small bowel obstruction.  3. Since the patient's cholecystectomy the intrahepatic biliary tree has   become mildly dilated and there is prominent dilation of the common bile   duct. I see no acute abnormality of the pancreas.  4.I do not see urinary tract obstruction.  5. The patient is undergone  posterior fixation of the lumbar spine with     placement of intradiscal devices at multiple levels.          Verified By: DAVID A. Martinique, M.D., MD  Assessment/Admission Diagnosis CT rev'd, compared to 3/13 SBO? vomiting leading to fall then rt hip and abd [pain admit reexamine PD consult   Electronic Signatures: Florene Glen (MD)  (Signed 16-Apr-13 05:06)  Authored: CHIEF COMPLAINT and HISTORY, PAST MEDICAL/SURGIAL HISTORY, ALLERGIES, FAMILY AND SOCIAL HISTORY, REVIEW OF SYSTEMS, PHYSICAL EXAM, LABS, Radiology, ASSESSMENT AND PLAN   Last Updated: 16-Apr-13 05:06 by Florene Glen (MD)

## 2015-01-19 NOTE — Consult Note (Signed)
Brief Consult Note: Diagnosis: small bowell obstruction.   Consult note dictated.   Orders entered.   Discussed with Attending MD.   Comments: 79 Y/O femal ewith nausea and vomiting has SBO on ct abdomen 1- SBO: managment per surgical team. 2-recuuret DVT: on warfari n INR is therapeutic, start [place] lovenox treatment dose or heparin drip when INR is subtherapeutic, monitoe INR daily. 3-HTN: changed po metoprolol to 5mg  IV Q6H 4-DM: NPO, hold po meds, on accucheck without coverage. 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 7-glaucoma :C/W timolol.  Electronic Signatures: Clarity Ciszek, Silver Huguenin (MD)  (Signed 16-Apr-13 05:47)  Authored: Brief Consult Note   Last Updated: 16-Apr-13 05:47 by Albertine Patricia (MD)

## 2015-01-19 NOTE — Consult Note (Signed)
PATIENT NAME:  [name], [name] MR#:  [id number] DATE OF BIRTH:  1935-10-02  DATE OF CONSULTATION:  01/11/2012  REFERRING PHYSICIAN:  Phoebe Perch, MD CONSULTING PHYSICIAN:  Albertine Patricia, MD  PRIMARY CARE PHYSICIAN: Lelon Huh, MD  HISTORY OF PRESENT ILLNESS: This is a 79 year old female who lives in an assisted living facility complaining of nausea and vomiting for one day. Upon admission to the ED, the patient had a CT of the abdomen and pelvis done which did show evidence of partial small bowel obstruction. The patient was admitted to the surgical service and they consulted for management of her medical problems. In the ED, the patient had no further episodes of vomiting. She had was seen and evaluated by surgery who requested NG tube insertion and put on low intermittent suction. The patient had recent hospital admission in September of 2012 for gallstone pancreatitis where the patient underwent laparoscopic cholecystectomy. The patient was afebrile but was found to have leukocytosis of 19.9 thousand. The patient lives at an assisted living facility. She is in bedridden and mobile only with a wheelchair.   PAST SURGICAL HISTORY:  1. Laparoscopic cholecystectomy in October 2012.  2. Multiple back surgeries. 3. Tonsillectomy. 4. Cholecystectomy, MD 5. Splenectomy. 6. Total abdominal hysterectomy. 7. Appendectomy.  PAST MEDICAL HISTORY:  1. Hypertension. 2. Hyperlipidemia.  3. Type 2 diabetes.  4. 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 5. Irregular heart beat. 6. Multiple back operations.  7. Arachnoid cyst. 8. CSF fistula.  9. MRSA bacteremia.  10. Idiopathic thrombocytopenic purpura.  11. Colonic polyps.  12. Hypothyroidism.  13. Glaucoma.  14. Depression.  15. Cystitis.  16. Gastroesophageal reflux disease. 17. Vitamin D deficiency.  18. Chronic anemia.  19. Coronary artery disease.  20. Diffuse lower extremity neuropathy.    MEDICATIONS: 1. Omeprazole 40 mg oral daily. 2. Prednisone 5 mg oral daily. 3. Multivitamin 1 tablet daily. 4. Ferrous sulfate 325 mg oral daily.  5. Metoprolol 25 mg oral twice a day.  6. Timolol 0.5% eyedrops both eyes twice daily. 7. Vitamin D2 5000 units monthly.  8. Aspirin 81 mg oral daily.  9. Citalopram 20 mg daily. 10. Levothyroxine 50 mcg daily.  11. Warfarin 7.5 mg Sunday, Tuesday, Thursday, and Saturday.  12. Warfarin 5 mg Monday, Wednesday, and Friday.  14. Glipizide 10 mg tablet daily. 15. Metformin 500 mg daily. 16. Gabapentin 400 mg three times daily. 17. Hydrocodone/acetaminophen 10/325 mg two tablets every four hours as needed for pain.  18. Promethazine 25 mg as needed every six hours.  FAMILY HISTORY: Mother died of head and neck carcinoma. Father died of cerebral hemorrhage. Sister has coronary artery disease and cholelithiasis.   SOCIAL HISTORY: She does not smoke, does not drink. No illicit drug use. She lives at an assisted living facility. She is immobile secondary to chronic back pain and multiple surgeries and neuropathy.   REVIEW OF SYSTEMS: CONSTITUTIONAL: She denies any fever. Complains of weakness and fatigue. EYES: Denies any blurry vision or vision change. ENT: Denies any tinnitus, ear pain, or hearing loss. RESPIRATORY: Denies any cough, wheezing, hemoptysis, or dyspnea. CARDIOVASCULAR: Denies any chest pain, orthopnea, or palpitations. GI: Denies any hematemesis or melena. Complains of nausea, also episode of vomiting, and abdominal pain. GENITOURINARY: Denies any dysuria or hematuria. ENDOCRINE: Denies any polyuria, polydipsia, heat or cold intolerance. Is hypothyroid. HEMATOLOGY: Has current lower extremity deep vein thrombosis. NEUROLOGIC: Has lower extremity numbness and chronic pain and weakness. MUSCULOSKELETAL: Has lower back pain, multiple  lower back surgeries, and lower extremity weakness.  PHYSICAL EXAMINATION:  VITALS: Temperature 99.1,  pulse 94, respiratory rate 20, saturation 96% on 2 liters nasal cannula, and blood pressure 124/95.   GENERAL: An elderly female who is currently comfortable, in no apparent distress.  HEENT: Head is normocephalic, atraumatic. Pupils are equal and reactive to light. Pink conjunctiva. Anicteric sclerae. Moist oral mucosa.   NECK: Supple. No thyromegaly. No JVD.   LUNGS: Good air entry bilaterally. No wheezing, rales, or rhonchi.  HEART: S1 and S2 no rubs, murmurs, or gallops.   ABDOMEN: Soft, nontender, and nondistended. Bowel sounds present.  EXTREMITIES: No edema. Tender to palpation with bilateral foot drop.   SKIN: No rash. No ulcers.  PSYCHIATRIC: Appropriate affect. Awake and alert x2.  NEUROLOGICAL: Lower extremity weakness. Cranial nerves are grossly intact.  PERTINENT LABS/STUDIES: Glucose 196, BUN 13, creatinine 0.55, sodium 140, potassium 4.2, chloride 103, and CO2 26. Troponin less than 0.02. White blood cell 19.9, hemoglobin 11, hematocrit 35.3, and platelets 473.  ASSESSMENT AND PLAN:   1. Partial small bowel obstruction: The patient was seen by surgery who currently is continuing with conservative management, NG tube on suctioning, and n.p.o.  2. Hypertension: We will change p.o. metoprolol to IV metoprolol 5 mg every six hours. 3. History of recurrent lower extremity deep vein thrombosis: The patient needs to be on anticoagulation. Currently her INR is therapeutic at 2.6. We will hold warfarin. When INR is subtherapeutic, less than 2, we will start the patient either on subcutaneous Lovenox treatment dose 1 mg/kg every 12 hours or IV heparin drip were warfarin to be resumed after her n.p.o. is discontinued.  4. Diabetes mellitus: The patient is n.p.o. We will have Accu-Chek every six hours and if her blood sugar is elevated we will start on insulin sliding-scale. 5. Glaucoma: Continue with timolol. 6. Steroid dependency: The patient has been on prednisone for so many  years. The family thinks it is because of pruritic arthritis or finger eczema, which only responded to steroid therapy. We will convert her p.o. prednisone to IV Solu-Medrol 40 mg daily. 7. Hypothyroidism: We will continue with IV Synthroid 25 mcg daily.  8. Leukocytosis: This is most likely secondary to stress from small bowel obstruction as she is afebrile. The patient was already started on IV Unasyn by the surgical team.   CODE STATUS: FULL CODE.   TOTAL TIME SPENT FOR PATIENT CARE: 55 minutes. ____________________________ Albertine Patricia, MD dse:slb D: 01/11/2012 06:04:25 ET T: 01/11/2012 10:05:56 ET JOB#: [id number]  cc: Albertine Patricia, MD, <Dictator> Kirstie Peri. Caryn Section, MD Carole Doner Graciela Husbands MD ELECTRONICALLY SIGNED 01/13/2012 22:04

## 2015-01-19 NOTE — H&P (Signed)
PATIENT NAME:  [name], [name] MR#:  [id number] DATE OF BIRTH:  May 26, 1936  DATE OF ADMISSION:  01/11/2012  CHIEF COMPLAINT: Nausea and vomiting.   HISTORY OF PRESENT ILLNESS: This is a patient who has had multiple emeses over the last day. She has had no abdominal pain. She became dizzy and fell after vomiting multiple times and not eating. After the fall she describes right hip pain and some mild abdominal pain, but that came on after the fall. She has had no flatus or bowel movement in two days, has never had an episode like this before. Denies fevers or chills. She was in the Emergency Room a month ago with similar symptoms of nausea and vomiting and was treated for urinary tract infection and that spontaneously resolved.   PAST MEDICAL HISTORY:  1. Myocardial infarction a year ago. 2. Coronary artery disease.  3. History of multiple DVTs. She is on Coumadin and aspirin.  4. Diabetes.  5. Hypertension.  6. History of biliary pancreatitis resulting in a cholecystectomy.  7. Back problems with stenosis and chronic back pain.   PAST SURGICAL HISTORY:  1. Total abdominal hysterectomy, bilateral salpingo-oophorectomy.  2. Cholecystectomy.  3. Appendectomy. 4. Splenectomy.   ALLERGIES: Dilaudid and fentanyl causing altered mental status, no rash or breathing problems.   FAMILY HISTORY: Noncontributory.   SOCIAL HISTORY: Patient lives in a nursing home.   REVIEW OF SYSTEMS: Patient is negative on all of her 10 system review with the exception of that mentioned in the history of present illness.   PHYSICAL EXAMINATION:  GENERAL: Disheveled and uncomfortable-appearing Caucasian female patient.   VITAL SIGNS: Vital signs are stable. She is afebrile.   HEENT: No scleral icterus.   NECK: No palpable neck nodes.   CHEST: Clear to auscultation.   CARDIAC: Regular rate and rhythm.   ABDOMEN: Soft, nontender, slightly distended and tympanitic. No peritoneal signs or scars present.    EXTREMITIES: Without edema. Calves are nontender.   NEUROLOGIC: Grossly intact.   INTEGUMENT: No jaundice.   LABORATORY, DIAGNOSTIC, AND RADIOLOGICAL DATA: White blood cell count 19.9, hemoglobin and hematocrit 11 and 35, platelet count 473, creatinine 0.55, glucose 196. PT 28.1 with an INR 2.6.   Hip films demonstrate no fracture on the right. CT scan suggests possible ileus versus small bowel obstruction.   ASSESSMENT AND PLAN: This is a patient with possible small bowel obstruction. She has had multiple surgeries in the past. I have recommended admitting the patient to the hospital, hydrating and controlling her nausea, vomiting and placing a nasogastric tube. Rationale for this has been discussed with she and her family. She will be re-examined with serial KUBs and laboratory values as well as physical exam.    They were in agreement with this plan.   ____________________________ Jerrol Banana. Burt Knack, MD rec:cms D: 01/11/2012 23:08:50 ET T: 01/12/2012 07:25:42 ET JOB#: [id number]  cc: Jerrol Banana. Burt Knack, MD, <Dictator> Florene Glen MD ELECTRONICALLY SIGNED 01/12/2012 20:48

## 2015-01-19 NOTE — Discharge Summary (Signed)
PATIENT NAME:  [name], [name] MR#:  [id number] DATE OF BIRTH:  [birth year]  DATE OF ADMISSION:  01/11/2012 DATE OF DISCHARGE:  01/14/2012  DISCHARGE DIAGNOSES:  1. Ileus versus small bowel obstruction.  2. Coronary artery disease.  3. History of multiple deep venous thrombosis, on anticoagulation. 4. Diabetes. 5. Hypertension. 6. History of biliary pancreatitis. 7. Back problems.   PAST SURGICAL HISTORY:  1. Total abdominal hysterectomy with bilateral salpingo-oophorectomy.  2. Cholecystectomy.  3. Appendectomy.  4. Splenectomy.   PROCEDURES: None.   HISTORY OF PRESENT ILLNESS/HOSPITAL COURSE: This is a patient with multiple emeses over a 24-hour period. She had no abdominal pain, but she was feeling dizzy, and she fell prior to coming to the Emergency Room; and when she fell she injured her right hip and side, at which time she started having some mild abdominal pain, but that was after her fall. Her chief complaint was really right hip pain. Hip films done in the ER were negative. I was asked to see the patient where there were physical findings and work-up suggestive of ileus versus small bowel obstruction.   The patient was placed in the hospital with a nasogastric decompression which caused her to resolve her ileus or SBO promptly. She was passing gas and having bowel movements, she had no further nausea or vomiting, and her NG tube was removed as her serial KUBs showed improvement in her bowel gas pattern. She is currently tolerating a regular diet. She has restarted her Coumadin and will be discharged in stable condition with no new medications. She is instructed to restart all the same medications that she was on prior to admission. She will follow up with her primary care physician only. She does not need to see the Surgical Service in the office unless she has recurrent nausea, vomiting, or any abdominal pain.   This was reviewed for her and her family. They understood and  agreed with this plan; and she will be discharged tomorrow morning back to her [facility].   ____________________________ Jerrol Banana. Burt Knack, MD rec:cbb D: 01/13/2012 19:47:23 ET T: 01/14/2012 13:54:48 ET JOB#: [id number]  cc: Jerrol Banana. Burt Knack, MD, <Dictator> Florene Glen MD ELECTRONICALLY SIGNED 01/21/2012 15:30

## 2015-01-22 LAB — CBC CANCER CENTER
Bands: 1 %
EOS PCT: 6 %
HCT: 33.7 % — AB (ref 35.0–47.0)
HGB: 10.6 g/dL — AB (ref 12.0–16.0)
LYMPHS PCT: 28 %
MCH: 25.1 pg — ABNORMAL LOW (ref 26.0–34.0)
MCHC: 31.5 g/dL — ABNORMAL LOW (ref 32.0–36.0)
MCV: 79 fL — ABNORMAL LOW (ref 80–100)
Monocytes: 7 %
Platelet: 479 x10 3/mm — ABNORMAL HIGH (ref 150–440)
RBC: 4.25 10*6/uL (ref 3.80–5.20)
RDW: 21.1 % — ABNORMAL HIGH (ref 11.5–14.5)
SEGMENTED NEUTROPHILS: 53 %
VARIANT LYMPHOCYTE - H4-RLYMPH: 5 %
WBC: 6.9 x10 3/mm (ref 3.6–11.0)

## 2015-09-01 DIAGNOSIS — J189 Pneumonia, unspecified organism: Secondary | ICD-10-CM

## 2015-09-01 HISTORY — DX: Pneumonia, unspecified organism: J18.9

## 2015-10-14 DIAGNOSIS — J449 Chronic obstructive pulmonary disease, unspecified: Secondary | ICD-10-CM

## 2015-10-14 DIAGNOSIS — M1990 Unspecified osteoarthritis, unspecified site: Secondary | ICD-10-CM

## 2015-10-14 DIAGNOSIS — J189 Pneumonia, unspecified organism: Secondary | ICD-10-CM

## 2015-10-14 DIAGNOSIS — D179 Benign lipomatous neoplasm, unspecified: Secondary | ICD-10-CM

## 2015-10-14 DIAGNOSIS — M810 Age-related osteoporosis without current pathological fracture: Secondary | ICD-10-CM

## 2015-10-14 HISTORY — DX: Age-related osteoporosis without current pathological fracture: M81.0

## 2015-10-14 HISTORY — DX: Benign lipomatous neoplasm, unspecified: D17.9

## 2015-10-14 HISTORY — DX: Chronic obstructive pulmonary disease, unspecified: J44.9

## 2015-10-14 HISTORY — DX: Unspecified osteoarthritis, unspecified site: M19.90

## 2015-10-17 ENCOUNTER — Ambulatory Visit (INDEPENDENT_AMBULATORY_CARE_PROVIDER_SITE_OTHER): Payer: Medicare Other | Admitting: Surgery

## 2015-10-17 ENCOUNTER — Encounter: Payer: Self-pay | Admitting: Surgery

## 2015-10-17 VITALS — BP 116/77 | HR 69 | Temp 98.1°F | Ht 62.0 in | Wt 115.0 lb

## 2015-10-17 DIAGNOSIS — R229 Localized swelling, mass and lump, unspecified: Secondary | ICD-10-CM | POA: Diagnosis not present

## 2015-10-17 DIAGNOSIS — IMO0002 Reserved for concepts with insufficient information to code with codable children: Secondary | ICD-10-CM

## 2015-10-17 NOTE — Patient Instructions (Addendum)
You will need to have an MRI of this mass in your right buttocks to determine what exactly this is. This has been scheduled for 11/06/15 at [facility] at 1100am. Arrive at 1045am. No prep is required.  I will call [place] to get transportation arranged for this appointment.   We will have you come back in to speak with Dr. Burt Knack regarding the results and plan of care going forward. This appointment will be on 11/21/15 in the [facility] at 1100am. We will call if the MRI results changes this appointment.

## 2015-10-17 NOTE — Progress Notes (Signed)
Surgical Consultation  10/17/2015  [name] is an 80 y.o. female.   CC: right buttock mass  HPI: this a patient who is wheelchair bound it has been told that she has a lipoma on her right buttock it has been there for "quite a while" and when pushed on that question she thinks it's been there at least 2 or 3 years. It is not increasing in size. She states that she has some pain in her buttock that radiates down her leg suggestive of sciatic nerve process. Again she is wheelchair-bound.  Past Medical History  Diagnosis Date  . Pneumonia 09/01/2015  . Arthritis 10/14/2015  . Osteoporosis 10/14/2015  . Lipoma 10/14/2015  . COPD (chronic obstructive pulmonary disease) ([place]) 10/14/2015  . Acute pancreatitis   . Hyperlipidemia   . Venous thrombosis   . Chronic cholecystitis   . Cardiac dysrhythmia   . Anemia   . Vitamin D deficiency   . Thyroid disease     Hypothyroidism  . Benign neoplasm of colon   . MRSA (methicillin resistant staph aureus) culture positive   . Neuropathy ([place])   . ITP (idiopathic thrombocytopenic purpura)     Past Surgical History  Procedure Laterality Date  . Appendectomy    . Cholecystectomy  06/29/2011    Laparoscopic- Dr. Pat Patrick  . Abdominal hysterectomy      Total  . Back surgery      Laminectomy  . Splenectomy      Open  . Tonsillectomy    . Bladder surgery      Family History  Problem Relation Age of Onset  . Cancer Mother   . Heart disease Mother   . Stroke Father     Social History:  reports that she has never smoked. She has never used smokeless tobacco. She reports that she does not drink alcohol or use illicit drugs.  Allergies:  Allergies  Allergen Reactions  . Dilaudid [Hydromorphone Hcl] Other (See Comments)    Altered Mental Status  . Fentanyl     Altered Mental Status  . Oxycodone Nausea Only  . Sulfa Antibiotics Hives  . Ultram [Tramadol]   . Codeine Rash  . Latex Rash    Medications reviewed.   Review of  Systems:   Review of Systems  Constitutional: Negative for fever and chills.  HENT: Negative.   Eyes: Negative.   Respiratory: Negative.   Cardiovascular: Negative.   Gastrointestinal: Negative.   Genitourinary: Negative.   Musculoskeletal: Negative.   Skin: Negative.   Neurological: Positive for weakness.  Psychiatric/Behavioral: Negative.      Physical Exam:  BP 116/77 mmHg  Pulse 69  Temp(Src) 98.1 F (36.7 C) (Oral)  Ht 5\' 2"  (1.575 m)  Wt 115 lb (52.164 kg)  BMI 21.03 kg/m2  Physical Exam  Constitutional: No distress.  Very thin appearing comfortable-appearing patient who is wheelchair-bound  HENT:  Head: Normocephalic and atraumatic.  Pulmonary/Chest: Effort normal. No respiratory distress.  Abdominal: Soft. There is no tenderness.  Musculoskeletal: She exhibits no edema.  Irregularly-shaped subcutaneous hard mass of the right buttock near the nubial fold It is soft and nontender with no overlying skin changes. It is somewhat mobile  Neurological: She is alert.  Skin: Skin is warm and dry. She is not diaphoretic.  Psychiatric: Mood and affect normal.  Vitals reviewed.     No results found for this or any previous visit (from the past 48 hour(s)). No results found.  Assessment/Plan:  This a patient who  is wheelchair-bound with a large irregularly shaped soft tissue mass in the subcutaneous tissues of the right buttock it does not seem to be growing but might cause some of her pain although most of her pain complex it would be more associated with a sciatic nerve process. Does of its irregular shape and location I doubt that this is a lipoma and I believe this does warrant an MRI. I will see her back after the MRI.  Florene Glen, MD, FACS

## 2015-11-06 ENCOUNTER — Ambulatory Visit
Admission: RE | Admit: 2015-11-06 | Discharge: 2015-11-06 | Disposition: A | Discharge status: Home / Self Care | Payer: Medicare Other | Source: Ambulatory Visit | Attending: Surgery | Admitting: Surgery

## 2015-11-06 DIAGNOSIS — R229 Localized swelling, mass and lump, unspecified: Secondary | ICD-10-CM | POA: Insufficient documentation

## 2015-11-06 DIAGNOSIS — IMO0002 Reserved for concepts with insufficient information to code with codable children: Secondary | ICD-10-CM

## 2015-11-06 MED ORDER — GADOBENATE DIMEGLUMINE 529 MG/ML IV SOLN
10.0000 mL | Freq: Once | INTRAVENOUS | Status: AC | PRN
  Administered 2015-11-06: 10 mL via INTRAVENOUS

## 2015-11-21 ENCOUNTER — Ambulatory Visit: Payer: Self-pay | Admitting: Surgery

## 2015-11-21 ENCOUNTER — Encounter: Payer: Self-pay | Admitting: Surgery

## 2015-11-21 ENCOUNTER — Ambulatory Visit (INDEPENDENT_AMBULATORY_CARE_PROVIDER_SITE_OTHER): Payer: Medicare Other | Admitting: Surgery

## 2015-11-21 VITALS — BP 137/78 | HR 83 | Temp 98.4°F | Ht 62.0 in | Wt 115.0 lb

## 2015-11-21 DIAGNOSIS — R229 Localized swelling, mass and lump, unspecified: Secondary | ICD-10-CM | POA: Diagnosis not present

## 2015-11-21 DIAGNOSIS — IMO0002 Reserved for concepts with insufficient information to code with codable children: Secondary | ICD-10-CM

## 2015-11-21 NOTE — Patient Instructions (Signed)
We will remove this mass in your buttocks, the week of 12/15/15. Please see the [facility]) Pre-care sheet that you have been given today.

## 2015-11-21 NOTE — Progress Notes (Signed)
Outpatient Surgical Follow Up  11/21/2015  [name] is an 80 y.o. female.   CC: Buttock mass  HPI: Special long-standing buttock mass in fact in comparison of her MRI to prior CTs this been present at least since 2014. It is stable on studies however the patient thinks that it is growing she also thinks that is causing her terrible pain and infected night she states she cries or self sleep with it the pain radiates to her leg on occasion but mostly stays close to the mass itself. It is on the right side.  Past Medical History  Diagnosis Date  . Pneumonia 09/01/2015  . Arthritis 10/14/2015  . Osteoporosis 10/14/2015  . Lipoma 10/14/2015  . COPD (chronic obstructive pulmonary disease) ([place]) 10/14/2015  . Acute pancreatitis   . Hyperlipidemia   . Venous thrombosis   . Chronic cholecystitis   . Cardiac dysrhythmia   . Anemia   . Vitamin D deficiency   . Thyroid disease     Hypothyroidism  . Benign neoplasm of colon   . MRSA (methicillin resistant staph aureus) culture positive   . Neuropathy ([place])   . ITP (idiopathic thrombocytopenic purpura)     Past Surgical History  Procedure Laterality Date  . Appendectomy    . Cholecystectomy  06/29/2011    Laparoscopic- Dr. Pat Patrick  . Abdominal hysterectomy      Total  . Back surgery      Laminectomy  . Splenectomy      Open  . Tonsillectomy    . Bladder surgery      Family History  Problem Relation Age of Onset  . Cancer Mother   . Heart disease Mother   . Stroke Father     Social History:  reports that she has never smoked. She has never used smokeless tobacco. She reports that she does not drink alcohol or use illicit drugs.  Allergies:  Allergies  Allergen Reactions  . Dilaudid [Hydromorphone Hcl] Other (See Comments)    Altered Mental Status  . Fentanyl     Altered Mental Status  . Oxycodone Nausea Only  . Sulfa Antibiotics Hives  . Ultram [Tramadol]   . Codeine Rash  . Latex Rash    Medications  reviewed.   Review of Systems:   Review of Systems  Constitutional: Negative for fever and chills.  HENT: Negative.   Eyes: Negative.   Respiratory: Negative.   Cardiovascular: Negative.   Gastrointestinal: Negative.   Genitourinary: Negative.   Musculoskeletal: Negative for myalgias, back pain, joint pain, falls and neck pain.       See H&P for right buttock pain  Skin: Negative.   Neurological: Negative.   Endo/Heme/Allergies: Negative.   Psychiatric/Behavioral: Negative.      Physical Exam:  BP 137/78 mmHg  Pulse 83  Temp(Src) 98.4 F (36.9 C) (Oral)  Ht 5\' 2"  (1.575 m)  Wt 115 lb (52.164 kg)  BMI 21.03 kg/m2  Physical Exam  Constitutional: She is oriented to person, place, and time. No distress.  Elderly frail female patient in a wheelchair  HENT:  Head: Normocephalic and atraumatic.  Eyes: Pupils are equal, round, and reactive to light. Right eye exhibits no discharge. Left eye exhibits no discharge. No scleral icterus.  Neck: Normal range of motion.  Cardiovascular: Normal rate, regular rhythm and normal heart sounds.   Pulmonary/Chest: Effort normal and breath sounds normal. No respiratory distress. She has no wheezes. She has no rales.  Abdominal: Soft. She exhibits no distension.  There is no tenderness.  Musculoskeletal: She exhibits no edema or tenderness.  Mass in right buttock which is nontender and unchanged from prior exam  Lymphadenopathy:    She has no cervical adenopathy.  Neurological: She is alert and oriented to person, place, and time.  Wheelchair bound  Skin: Skin is warm and dry. No rash noted. She is not diaphoretic. No erythema.  Vitals reviewed.     No results found for this or any previous visit (from the past 48 hour(s)). No results found.  Assessment/Plan:  Buttock mass probable calcified hematoma on MRI is been present since at least 2014 on CT scan which shows that it is not changed in size in those 3 year. Discussed with the  patient surgical options as is causing her tremendous pain and infectious states she cries or self sleep each night with this pain. He can see no other cause for her pain at this point. With all that in mind I would suggest excisional biopsy of this area and I discussed with her the rationale for this the options of observation the risks of not resolving her symptoms and the risks of bleeding infection drain placement recurrence etc. she understood and agreed to proceed with this plan.  Florene Glen, MD, FACS

## 2015-11-24 ENCOUNTER — Telehealth: Payer: Self-pay | Admitting: Surgery

## 2015-11-24 NOTE — Telephone Encounter (Signed)
Pt's daughter Lenna Sciara) and the facility the patient is out was advised of pre op date/time and sx date. Sx: 12/18/15 with Dr Burt Knack for excisional biopsy of buttocks. Pre op: 12/11/15 @ 10:30am--Office.   Patient made aware to call [phone], between 1-3:00pm the day before surgery, to find out what time to arrive.     Transportation has been arranged with the facility.

## 2015-11-24 NOTE — Telephone Encounter (Signed)
Seth Bake, NP from patient's PCP has called and would like to speak with a nurse if reference to the surgery/anesthesia that the patient will be having. She states that the family and herself have concerns of the patient being put under anesthesia. Please contact the Seth Bake at [phone] Phone.   Date of surgery 12/18/15 with Dr Cooper--Excisional biopsy of right buttocks.

## 2015-11-25 NOTE — Telephone Encounter (Signed)
Spoke with Ferd Hibbs, NP at [facility] at this time. She is not comfortable with patient having either conscious sedation or general anesthesia to remove this mass in her buttocks. I explained that both times we have examined her in office, patient has been extremely uncomfortable with palpation of this mass and this will not be able to be removed without some type of sedation for the patient. I explained that we have met with the patient and a family member on 2 occasions and they are both in agreement with this decision and this is why it has been placed on our OR schedule.  NP has requested that OR date be taken off the OR schedule and she is going to obtain clearances from both Cardiology and Pulmonology. If both clearances are obtained, she will call our office back for patient to be seen once again by Dr. Burt Knack.

## 2015-12-11 ENCOUNTER — Other Ambulatory Visit: Payer: Medicare Other

## 2015-12-18 ENCOUNTER — Encounter: Admission: RE | Payer: Self-pay | Source: Ambulatory Visit

## 2015-12-18 ENCOUNTER — Ambulatory Visit: Admission: RE | Admit: 2015-12-18 | Payer: Medicare Other | Source: Ambulatory Visit | Admitting: Surgery

## 2015-12-18 SURGERY — EXCISION MASS
Anesthesia: Moderate Sedation | Laterality: Right

## 2016-09-08 ENCOUNTER — Ambulatory Visit: Admission: RE | Admit: 2016-09-08 | Payer: Medicare Other | Source: Ambulatory Visit

## 2016-09-08 ENCOUNTER — Other Ambulatory Visit: Payer: Self-pay | Admitting: Internal Medicine

## 2016-09-08 DIAGNOSIS — R1084 Generalized abdominal pain: Secondary | ICD-10-CM

## 2016-09-09 ENCOUNTER — Ambulatory Visit
Admission: RE | Admit: 2016-09-09 | Discharge: 2016-09-09 | Disposition: A | Discharge status: Home / Self Care | Payer: Medicare Other | Source: Ambulatory Visit | Attending: Internal Medicine | Admitting: Internal Medicine

## 2016-09-09 DIAGNOSIS — K56609 Unspecified intestinal obstruction, unspecified as to partial versus complete obstruction: Secondary | ICD-10-CM | POA: Insufficient documentation

## 2016-09-09 DIAGNOSIS — K760 Fatty (change of) liver, not elsewhere classified: Secondary | ICD-10-CM | POA: Diagnosis not present

## 2016-09-09 DIAGNOSIS — M8580 Other specified disorders of bone density and structure, unspecified site: Secondary | ICD-10-CM | POA: Insufficient documentation

## 2016-09-09 DIAGNOSIS — R1084 Generalized abdominal pain: Secondary | ICD-10-CM

## 2016-09-09 DIAGNOSIS — K8689 Other specified diseases of pancreas: Secondary | ICD-10-CM | POA: Insufficient documentation

## 2016-09-09 DIAGNOSIS — M4185 Other forms of scoliosis, thoracolumbar region: Secondary | ICD-10-CM | POA: Insufficient documentation

## 2016-09-09 DIAGNOSIS — R109 Unspecified abdominal pain: Secondary | ICD-10-CM | POA: Insufficient documentation

## 2016-09-09 DIAGNOSIS — R112 Nausea with vomiting, unspecified: Secondary | ICD-10-CM | POA: Insufficient documentation

## 2016-09-09 DIAGNOSIS — Z9081 Acquired absence of spleen: Secondary | ICD-10-CM | POA: Diagnosis not present

## 2016-09-09 DIAGNOSIS — Z90710 Acquired absence of both cervix and uterus: Secondary | ICD-10-CM | POA: Insufficient documentation

## 2016-09-09 DIAGNOSIS — R188 Other ascites: Secondary | ICD-10-CM | POA: Insufficient documentation

## 2016-09-09 HISTORY — DX: Type 2 diabetes mellitus without complications: E11.9

## 2016-09-09 HISTORY — DX: Essential (primary) hypertension: I10

## 2016-09-09 LAB — POCT I-STAT CREATININE: Creatinine, Ser: 0.4 mg/dL — ABNORMAL LOW (ref 0.44–1.00)

## 2016-09-09 MED ORDER — IOPAMIDOL (ISOVUE-300) INJECTION 61%
75.0000 mL | Freq: Once | INTRAVENOUS | Status: AC | PRN
  Administered 2016-09-09: 75 mL via INTRAVENOUS

## 2016-09-27 DEATH — deceased
